# Patient Record
Sex: Female | Born: 1990 | Race: Black or African American | Hispanic: No | Marital: Single | Smoking: Never smoker
Health system: Southern US, Community
[De-identification: ages and names within clinical notes are randomized; demographics above are authoritative.]

## PROBLEM LIST (undated history)

## (undated) ENCOUNTER — Observation Stay: Admission: RE | Payer: Medicaid HMO | Source: Ambulatory Visit | Admitting: Obstetrics & Gynecology

## (undated) ENCOUNTER — Emergency Department
Admission: EM | Disposition: A | Discharge status: Other Acute Inpatient Hospital | Payer: Medicaid HMO | Attending: Emergency Medicine | Admitting: Emergency Medicine

## (undated) DIAGNOSIS — F32A Depression, unspecified: Secondary | ICD-10-CM

## (undated) DIAGNOSIS — K219 Gastro-esophageal reflux disease without esophagitis: Secondary | ICD-10-CM

## (undated) DIAGNOSIS — Z6841 Body Mass Index (BMI) 40.0 and over, adult: Secondary | ICD-10-CM

## (undated) DIAGNOSIS — R7303 Prediabetes: Secondary | ICD-10-CM

## (undated) DIAGNOSIS — Z973 Presence of spectacles and contact lenses: Secondary | ICD-10-CM

## (undated) DIAGNOSIS — F329 Major depressive disorder, single episode, unspecified: Secondary | ICD-10-CM

## (undated) DIAGNOSIS — D649 Anemia, unspecified: Secondary | ICD-10-CM

## (undated) DIAGNOSIS — F419 Anxiety disorder, unspecified: Secondary | ICD-10-CM

## (undated) DIAGNOSIS — J189 Pneumonia, unspecified organism: Secondary | ICD-10-CM

## (undated) DIAGNOSIS — S83282A Other tear of lateral meniscus, current injury, left knee, initial encounter: Secondary | ICD-10-CM

## (undated) HISTORY — PX: OTHER SURGICAL HISTORY: SHX169

## (undated) HISTORY — PX: TONSILLECTOMY: SUR1361

## (undated) HISTORY — PX: WISDOM TOOTH EXTRACTION: SHX21

## (undated) HISTORY — PX: KNEE SURGERY: SHX244

---

## 2009-03-30 ENCOUNTER — Emergency Department: Admit: 2009-03-30 | Payer: Self-pay | Source: Emergency Department | Admitting: Emergency Medicine

## 2009-06-20 ENCOUNTER — Emergency Department: Admit: 2009-06-20 | Payer: Self-pay | Source: Emergency Department | Admitting: Emergency Medicine

## 2009-06-20 LAB — BASIC METABOLIC PANEL
BUN: 16 mg/dL (ref 7.0–19.0)
CO2: 20 mEq/L — ABNORMAL LOW (ref 22–29)
Calcium: 8.8 mg/dL (ref 8.5–10.5)
Chloride: 107 mEq/L (ref 98–107)
Creatinine: 0.7 mg/dL (ref 0.6–1.0)
Glucose: 99 mg/dL (ref 70–100)
Potassium: 4.1 mEq/L (ref 3.5–5.1)
Sodium: 139 mEq/L (ref 136–145)

## 2009-06-20 LAB — CBC AND DIFFERENTIAL
Basophils Absolute: 0 10*3/uL (ref 0.00–0.20)
Basophils: 0 % (ref 0–2)
Eosinophils Absolute: 0.1 10*3/uL (ref 0.00–0.70)
Eosinophils: 2 % (ref 0–5)
Granulocytes Absolute: 3.3 10*3/uL (ref 1.80–8.10)
Hematocrit: 31 % — ABNORMAL LOW (ref 37.0–47.0)
Hgb: 10.3 g/dL — ABNORMAL LOW (ref 12.0–16.0)
Immature Granulocytes Absolute: 0.03 10*3/uL
Immature Granulocytes: 1 % (ref 0–1)
Lymphocytes Absolute: 2.9 10*3/uL (ref 0.50–4.40)
Lymphocytes: 43 % — ABNORMAL HIGH (ref 15–41)
MCH: 24.8 pg — ABNORMAL LOW (ref 28.0–32.0)
MCHC: 33.2 g/dL (ref 32.0–36.0)
MCV: 74.7 fL — ABNORMAL LOW (ref 80.0–100.0)
MPV: 10.5 fL (ref 9.4–12.3)
Monocytes Absolute: 0.3 10*3/uL (ref 0.00–1.20)
Monocytes: 5 % (ref 0–11)
Neutrophils %: 49 % — ABNORMAL LOW (ref 52–75)
Platelets: 285 10*3/uL (ref 140–400)
RBC: 4.15 10*6/uL — ABNORMAL LOW (ref 4.20–5.40)
RDW: 14 % (ref 12–15)
WBC: 6.64 10*3/uL (ref 3.50–10.80)

## 2009-06-20 LAB — D-DIMER - SOFT: D-Dimer: <100 ng/mL (ref 0–400)

## 2009-06-20 LAB — HEMOLYSIS INDEX: Hemolysis Index: 3

## 2011-02-05 LAB — ECG 12-LEAD
Atrial Rate: 64 {beats}/min
P Axis: 18 degrees
P-R Interval: 170 ms
Q-T Interval: 416 ms
QRS Duration: 84 ms
QTC Calculation (Bezet): 429 ms
R Axis: 50 degrees
T Axis: 38 degrees
Ventricular Rate: 64 {beats}/min

## 2011-06-24 ENCOUNTER — Emergency Department
Admit: 2011-06-24 | Discharge: 2011-06-24 | Disposition: A | Discharge status: Home / Self Care | Payer: Self-pay | Source: Emergency Department | Admitting: Emergency Medicine

## 2011-06-24 LAB — CBC AND DIFFERENTIAL
Basophils Absolute Automated: 0.04 10*3/uL (ref 0.00–0.20)
Basophils Automated: 1 % (ref 0–2)
Eosinophils Absolute Automated: 0.24 10*3/uL (ref 0.00–0.70)
Eosinophils Automated: 3 % (ref 0–5)
Hematocrit: 35.3 % — ABNORMAL LOW (ref 37.0–47.0)
Hgb: 11.6 g/dL — ABNORMAL LOW (ref 12.0–16.0)
Lymphocytes Absolute Automated: 2.27 10*3/uL (ref 0.50–4.40)
Lymphocytes Automated: 31 % (ref 15–41)
MCH: 24.8 pg — ABNORMAL LOW (ref 28.0–32.0)
MCHC: 32.9 g/dL (ref 32.0–36.0)
MCV: 75.6 fL — ABNORMAL LOW (ref 80.0–100.0)
MPV: 9.7 fL (ref 9.4–12.3)
Monocytes Absolute Automated: 0.49 10*3/uL (ref 0.00–1.20)
Monocytes: 7 % (ref 0–11)
Neutrophils Absolute: 4.18 10*3/uL (ref 1.80–8.10)
Neutrophils: 58 % (ref 52–75)
Platelets: 366 10*3/uL (ref 140–400)
RBC: 4.67 10*6/uL (ref 4.20–5.40)
RDW: 14 % (ref 12–15)
WBC: 7.22 10*3/uL (ref 3.50–10.80)

## 2011-06-24 LAB — BASIC METABOLIC PANEL
BUN: 12 mg/dL (ref 7–21)
CO2: 27 mEq/L (ref 22–31)
Calcium: 9.2 mg/dL (ref 8.6–10.2)
Chloride: 104 mEq/L (ref 98–107)
Creatinine: 0.7 mg/dL (ref 0.5–1.4)
Glucose: 80 mg/dL (ref 70–100)
Potassium: 4.2 mEq/L (ref 3.6–5.0)
Sodium: 143 mEq/L (ref 136–143)

## 2011-06-24 LAB — URINALYSIS, REFLEX TO MICROSCOPIC EXAM IF INDICATED
Bilirubin, UA: NEGATIVE
Blood, UA: NEGATIVE
Glucose, UA: NEGATIVE
Ketones UA: NEGATIVE
Leukocyte Esterase, UA: NEGATIVE
Nitrite, UA: NEGATIVE
Protein, UR: NEGATIVE
Specific Gravity UA POCT: 1.025 (ref 1.001–1.035)
Urine pH: 6.5 (ref 5.0–8.0)
Urobilinogen, UA: 0.2 mg/dL

## 2011-06-24 LAB — HEPATIC FUNCTION PANEL
ALT: 28 U/L (ref 4–36)
AST (SGOT): 17 U/L (ref 10–41)
Albumin/Globulin Ratio: 1.1 (ref 1.1–1.8)
Albumin: 3.7 g/dL (ref 3.4–4.9)
Alkaline Phosphatase: 81 U/L (ref 43–112)
Bilirubin Direct: 0 mg/dL (ref 0.0–0.3)
Bilirubin Indirect: 0.1 mg/dL (ref 0.0–1.1)
Bilirubin, Total: 0.3 mg/dL (ref 0.2–1.0)
Globulin: 3.3 g/dL (ref 2.0–3.7)
Protein, Total: 7 g/dL (ref 6.0–8.0)

## 2011-06-24 LAB — GFR: EGFR: >60

## 2011-06-24 LAB — SEDIMENTATION RATE: Sed Rate: 26 mm/Hr (ref 0–26)

## 2011-06-24 LAB — CK: Creatine Kinase (CK): 114 U/L (ref 0–140)

## 2011-06-24 LAB — LIPASE: Lipase: 83 U/L (ref 23–300)

## 2011-06-24 LAB — IHS D-DIMER: D-Dimer: 0.35 ug/ml FEU (ref 0.00–0.51)

## 2011-06-24 LAB — TROPONIN I: Troponin I: <0.01 ng/mL (ref 0.00–0.03)

## 2011-06-24 LAB — URINE HCG QUALITATIVE: Urine HCG Qualitative: NEGATIVE

## 2011-06-25 LAB — ECG 12-LEAD
Atrial Rate: 64 {beats}/min
P Axis: 49 degrees
P-R Interval: 166 ms
Q-T Interval: 398 ms
QRS Duration: 82 ms
QTC Calculation (Bezet): 410 ms
R Axis: 60 degrees
T Axis: 22 degrees
Ventricular Rate: 64 {beats}/min

## 2011-09-01 ENCOUNTER — Emergency Department
Admit: 2011-09-01 | Discharge: 2011-09-01 | Disposition: A | Discharge status: Home / Self Care | Payer: Self-pay | Source: Emergency Department | Admitting: Emergency Medicine

## 2011-09-02 LAB — ECG 12-LEAD
Atrial Rate: 64 {beats}/min
P Axis: 22 degrees
P-R Interval: 166 ms
Q-T Interval: 390 ms
QRS Duration: 86 ms
QTC Calculation (Bezet): 402 ms
R Axis: 50 degrees
T Axis: 19 degrees
Ventricular Rate: 64 {beats}/min

## 2013-11-08 ENCOUNTER — Emergency Department (HOSPITAL_BASED_OUTPATIENT_CLINIC_OR_DEPARTMENT_OTHER)
Admission: EM | Admit: 2013-11-08 | Discharge: 2013-11-08 | Disposition: A | Discharge status: Home / Self Care | Payer: Medicaid - Out of State | Attending: Emergency Medicine | Admitting: Emergency Medicine

## 2013-11-08 ENCOUNTER — Inpatient Hospital Stay (HOSPITAL_COMMUNITY)
Admission: AD | Admit: 2013-11-08 | Discharge: 2013-11-08 | Discharge status: Against Medical Advice | Payer: Medicaid - Out of State | Source: Ambulatory Visit | Attending: Obstetrics & Gynecology | Admitting: Obstetrics & Gynecology

## 2013-11-08 ENCOUNTER — Encounter (HOSPITAL_BASED_OUTPATIENT_CLINIC_OR_DEPARTMENT_OTHER): Payer: Self-pay | Admitting: Emergency Medicine

## 2013-11-08 DIAGNOSIS — X58XXXA Exposure to other specified factors, initial encounter: Secondary | ICD-10-CM | POA: Insufficient documentation

## 2013-11-08 DIAGNOSIS — Y9289 Other specified places as the place of occurrence of the external cause: Secondary | ICD-10-CM | POA: Insufficient documentation

## 2013-11-08 DIAGNOSIS — S40021A Contusion of right upper arm, initial encounter: Secondary | ICD-10-CM

## 2013-11-08 DIAGNOSIS — S40029A Contusion of unspecified upper arm, initial encounter: Secondary | ICD-10-CM | POA: Insufficient documentation

## 2013-11-08 DIAGNOSIS — Y9389 Activity, other specified: Secondary | ICD-10-CM | POA: Insufficient documentation

## 2013-11-08 MED ORDER — NAPROXEN 500 MG PO TABS: 500.0000 mg | ORAL_TABLET | Freq: Two times a day (BID) | ORAL | Status: DC

## 2013-11-08 NOTE — Discharge Instructions (Signed)
Apply ice to your hematoma. Take Naprosyn as needed for pain. Refer to attached documents for more information.

## 2013-11-08 NOTE — MAU Note (Signed)
[facility] registration [facility]

## 2013-11-08 NOTE — ED Provider Notes (Signed)
Medical screening examination/treatment/procedure(s) were performed by non-physician practitioner and as supervising physician I was immediately available for consultation/collaboration.   EKG Interpretation None       Doug SouSam Jacubowitz, MD 11/08/13 2356

## 2013-11-08 NOTE — ED Notes (Signed)
Pt. Reports  She had blood taken / plasma taken on last Thursday.  Pt. Reports pain starting in the R arm on Monday with bruising.  Pt. Reports pain as sharp shooting in the arm and down to the wrist on the R.

## 2013-11-08 NOTE — ED Provider Notes (Signed)
CSN: [id number]     Arrival date & time 11/08/13  1909 History   First MD Initiated Contact with Patient 11/08/13 1937     Chief Complaint  Patient presents with  . Extremity Pain     (Consider location/radiation/quality/duration/timing/severity/associated sxs/prior Treatment) Patient is a 23 y.o. female presenting with extremity pain. The history is provided by the patient. No language interpreter was used.  Extremity Pain This is a new problem. The current episode started in the past 7 days. The problem occurs constantly. The problem has been unchanged. Associated symptoms include arthralgias. Pertinent negatives include no abdominal pain, chest pain, chills, fatigue, fever, nausea, neck pain, vomiting or weakness. Exacerbated by: palpation of the affected area. She has tried ice and acetaminophen for the symptoms. The treatment provided no relief.    History reviewed. No pertinent past medical history. History reviewed. No pertinent past surgical history. No family history on file. History  Substance Use Topics  . Smoking status: Never Smoker   . Smokeless tobacco: Not on file  . Alcohol Use: No   OB History   Grav Para Term Preterm Abortions TAB SAB Ect Mult Living                 Review of Systems  Constitutional: Negative for fever, chills and fatigue.  HENT: Negative for trouble swallowing.   Eyes: Negative for visual disturbance.  Respiratory: Negative for shortness of breath.   Cardiovascular: Negative for chest pain and palpitations.  Gastrointestinal: Negative for nausea, vomiting, abdominal pain and diarrhea.  Genitourinary: Negative for dysuria and difficulty urinating.  Musculoskeletal: Positive for arthralgias. Negative for neck pain.  Skin: Negative for color change.  Neurological: Negative for dizziness and weakness.  Psychiatric/Behavioral: Negative for dysphoric mood.      Allergies  Review of patient's allergies indicates no known allergies.  Home  Medications   Prior to Admission medications   Not on File   BP 135/55  Pulse 75  Temp(Src) 99 F (37.2 C) (Oral)  Resp 18  Ht 5\' 8"  (1.727 m)  Wt 340 lb (154.223 kg)  BMI 51.71 kg/m2  SpO2 100%  LMP 10/20/2013 Physical Exam  Nursing note and vitals reviewed. Constitutional: She is oriented to person, place, and time. She appears well-developed and well-nourished. No distress.  HENT:  Head: Normocephalic and atraumatic.  Eyes: Conjunctivae and EOM are normal.  Neck: Normal range of motion.  Cardiovascular: Normal rate, regular rhythm and intact distal pulses.  Exam reveals no gallop and no friction rub.   No murmur heard. Bilateral radial pulses intact and equal.   Pulmonary/Chest: Effort normal and breath sounds normal. She has no wheezes. She has no rales. She exhibits no tenderness.  Musculoskeletal: Normal range of motion.  Neurological: She is alert and oriented to person, place, and time. Coordination normal.  Speech is goal-oriented. Moves limbs without ataxia.   Skin: Skin is warm and dry.  Hematoma distal of right AC with purple discoloration of the skin and tenderness to palpation over the right AC.   Psychiatric: She has a normal mood and affect. Her behavior is normal.    ED Course  Procedures (including critical care time) Labs Review Labs Reviewed - No data to display  Imaging Review No results found.   EKG Interpretation None      MDM   Final diagnoses:  Traumatic hematoma of right upper arm    7:45 PM Patient has a hematoma of right AC from donating blood. No signs of  infection. Patient will be discharged with naprosyn. Vitals stable and patient afebrile. No neurovascular compromise.     Emilia BeckKaitlyn Szekalski, PA-C 11/08/13 1954

## 2013-11-24 ENCOUNTER — Encounter (HOSPITAL_BASED_OUTPATIENT_CLINIC_OR_DEPARTMENT_OTHER): Payer: Self-pay | Admitting: Emergency Medicine

## 2013-11-24 ENCOUNTER — Emergency Department (HOSPITAL_BASED_OUTPATIENT_CLINIC_OR_DEPARTMENT_OTHER)
Admission: EM | Admit: 2013-11-24 | Discharge: 2013-11-24 | Disposition: A | Discharge status: Home / Self Care | Payer: Medicaid - Out of State | Attending: Emergency Medicine | Admitting: Emergency Medicine

## 2013-11-24 DIAGNOSIS — Z791 Long term (current) use of non-steroidal anti-inflammatories (NSAID): Secondary | ICD-10-CM | POA: Insufficient documentation

## 2013-11-24 DIAGNOSIS — R112 Nausea with vomiting, unspecified: Secondary | ICD-10-CM | POA: Insufficient documentation

## 2013-11-24 DIAGNOSIS — H579 Unspecified disorder of eye and adnexa: Secondary | ICD-10-CM | POA: Diagnosis present

## 2013-11-24 DIAGNOSIS — H1130 Conjunctival hemorrhage, unspecified eye: Secondary | ICD-10-CM | POA: Diagnosis not present

## 2013-11-24 DIAGNOSIS — H1133 Conjunctival hemorrhage, bilateral: Secondary | ICD-10-CM

## 2013-11-24 MED ORDER — FLUORESCEIN SODIUM 1 MG OP STRP
ORAL_STRIP | OPHTHALMIC | Status: AC
  Administered 2013-11-24: 23:00:00
  Filled 2013-11-24: qty 1

## 2013-11-24 MED ORDER — TETRACAINE HCL 0.5 % OP SOLN
OPHTHALMIC | Status: AC
  Administered 2013-11-24: 23:00:00
  Filled 2013-11-24: qty 2

## 2013-11-24 MED ORDER — ONDANSETRON 4 MG PO TBDP
4.0000 mg | ORAL_TABLET | Freq: Once | ORAL | Status: AC
  Administered 2013-11-24: 4 mg via ORAL
  Filled 2013-11-24: qty 1

## 2013-11-24 MED ORDER — HYDROCODONE-ACETAMINOPHEN 5-325 MG PO TABS
2.0000 | ORAL_TABLET | Freq: Once | ORAL | Status: AC
  Administered 2013-11-24: 2 via ORAL
  Filled 2013-11-24: qty 2

## 2013-11-24 NOTE — ED Notes (Signed)
At d/c pt. Still with concern about bilateral eye pain. States she has been drinking fluids and not nauseated at present. I asked Trisha MangleKaren Sophia, PA to speak with pt. Trisha MangleKaren Sophia and Dr. Criss AlvineGoldston, MD at beside to speak with patient.  No change in plan of care and orders received to d/c patient home as written.

## 2013-11-24 NOTE — Discharge Instructions (Signed)
Subconjunctival Hemorrhage A subconjunctival hemorrhage is a bright red patch covering a portion of the white of the eye. The white part of the eye is called the sclera, and it is covered by a thin membrane called the conjunctiva. This membrane is clear, except for tiny blood vessels that you can see with the naked eye. When your eye is irritated or inflamed and becomes red, it is because the vessels in the conjunctiva are swollen. Sometimes, a blood vessel in the conjunctiva can break and bleed. When this occurs, the blood builds up between the conjunctiva and the sclera, and spreads out to create a red area. The red spot may be very small at first. It may then spread to cover a larger part of the surface of the eye, or even all of the visible white part of the eye. In almost all cases, the blood will go away and the eye will become white again. Before completely dissolving, however, the red area may spread. It may also become brownish-yellow in color, before going away. If a lot of blood collects under the conjunctiva, it may look like a bulge on the surface of the eye. This looks scary, but it will also eventually flatten out and go away. Subconjunctival hemorrhages do not cause pain, but if swollen, may cause a feeling of irritation. There is no effect on vision.  CAUSES   The most common cause is mild trauma (rubbing the eye, irritation).  Subconjunctival hemorrhages can happen because of coughing or straining (lifting heavy objects), vomiting, or sneezing.  In some cases, your doctor may want to check your blood pressure. High blood pressure can also cause a sunconjunctival hemorrhage.  Severe trauma or blunt injuries.  Diseases that affect blood clotting (hemophilia, leukemia).  Abnormalities of blood vessels behind the eye (carotid cavernous sinus fistula).  Tumors behind the eye.  Certain drugs (aspirin, coumadin, heparin).  Recent eye surgery. HOME CARE INSTRUCTIONS   Do not worry  about the appearance of your eye. You may continue your usual activities.  Often, follow-up is not necessary. SEEK MEDICAL CARE IF:   Your eye becomes painful.  The bleeding does not disappear within 3 weeks.  Bleeding occurs elsewhere, for example, under the skin, in the mouth, or in the other eye.  You have recurring subconjunctival hemorrhages. SEEK IMMEDIATE MEDICAL CARE IF:   Your vision changes or you have difficulty seeing.  You develop severe headache, persistent vomiting, confusion, or abnormal drowsiness (lethargy).  Your eye seems to bulge or protrude from the eye socket.  You notice the sudden appearance of bruises, or have spontaneous bleeding elsewhere on your body. Document Released: 05/10/2005 Document Revised: 08/02/2011 Document Reviewed: 04/07/2009 [facility] Patient Information 2015 [place], [place]. This information is not intended to replace advice given to you by your health care provider. Make sure you discuss any questions you have with your health care provider. Nausea and Vomiting Nausea is a sick feeling that often comes before throwing up (vomiting). Vomiting is a reflex where stomach contents come out of your mouth. Vomiting can cause severe loss of body fluids (dehydration). Children and elderly adults can become dehydrated quickly, especially if they also have diarrhea. Nausea and vomiting are symptoms of a condition or disease. It is important to find the cause of your symptoms. CAUSES  Direct irritation of the stomach lining. This irritation can result from increased acid production (gastroesophageal reflux disease), infection, food poisoning, taking certain medicines (such as nonsteroidal anti-inflammatory drugs), alcohol use, or tobacco  use. Signals from the brain.These signals could be caused by a headache, heat exposure, an inner ear disturbance, increased pressure in the brain from injury, infection, a tumor, or a concussion, pain, emotional stimulus,  or metabolic problems. An obstruction in the gastrointestinal tract (bowel obstruction). Illnesses such as diabetes, hepatitis, gallbladder problems, appendicitis, kidney problems, cancer, sepsis, atypical symptoms of a heart attack, or eating disorders. Medical treatments such as chemotherapy and radiation. Receiving medicine that makes you sleep (general anesthetic) during surgery. DIAGNOSIS Your caregiver may ask for tests to be done if the problems do not improve after a few days. Tests may also be done if symptoms are severe or if the reason for the nausea and vomiting is not clear. Tests may include: Urine tests. Blood tests. Stool tests. Cultures (to look for evidence of infection). X-rays or other imaging studies. Test results can help your caregiver make decisions about treatment or the need for additional tests. TREATMENT You need to stay well hydrated. Drink frequently but in small amounts.You may wish to drink water, sports drinks, clear broth, or eat frozen ice pops or gelatin dessert to help stay hydrated.When you eat, eating slowly may help prevent nausea.There are also some antinausea medicines that may help prevent nausea. HOME CARE INSTRUCTIONS  Take all medicine as directed by your caregiver. If you do not have an appetite, do not force yourself to eat. However, you must continue to drink fluids. If you have an appetite, eat a normal diet unless your caregiver tells you differently. Eat a variety of complex carbohydrates (rice, wheat, potatoes, bread), lean meats, yogurt, fruits, and vegetables. Avoid high-fat foods because they are more difficult to digest. Drink enough water and fluids to keep your urine clear or pale yellow. If you are dehydrated, ask your caregiver for specific rehydration instructions. Signs of dehydration may include: Severe thirst. Dry lips and mouth. Dizziness. Dark urine. Decreasing urine frequency and amount. Confusion. Rapid breathing or  pulse. SEEK IMMEDIATE MEDICAL CARE IF:  You have blood or brown flecks (like coffee grounds) in your vomit. You have black or bloody stools. You have a severe headache or stiff neck. You are confused. You have severe abdominal pain. You have chest pain or trouble breathing. You do not urinate at least once every 8 hours. You develop cold or clammy skin. You continue to vomit for longer than 24 to 48 hours. You have a fever. MAKE SURE YOU:  Understand these instructions. Will watch your condition. Will get help right away if you are not doing well or get worse. Document Released: 05/10/2005 Document Revised: 08/02/2011 Document Reviewed: 10/07/2010 [facility]ExitCare Patient Information 2015 [place]ExitCare, [place]LL[place]. This information is not intended to replace advice given to you by your health care provider. Make sure you discuss any questions you have with your health care provider.

## 2013-11-24 NOTE — ED Notes (Signed)
Pt states that she vomited this afternoon and developed difficulty seeing, she reports bleeding into her eye,
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
## 2013-11-24 NOTE — ED Provider Notes (Signed)
CSN: [id number]     Arrival date & time 11/24/13  2121 History   First MD Initiated Contact with Patient 11/24/13 2223     Chief Complaint  Patient presents with  . Eye Problem     (Consider location/radiation/quality/duration/timing/severity/associated sxs/prior Treatment) Patient is a 23 y.o. female presenting with eye problem. The history is provided by the patient. No language interpreter was used.  Eye Problem Location:  Both Quality:  Aching Severity:  Moderate Onset quality:  Gradual Timing:  Constant Progression:  Worsening Chronicity:  New Relieved by:  Nothing Worsened by:  Nothing tried Ineffective treatments:  None tried Associated symptoms: redness and vomiting   Risk factors: conjunctival hemorrhage   Pt reports she ate a hamburger that was undercooked.   History reviewed. No pertinent past medical history. History reviewed. No pertinent past surgical history. History reviewed. No pertinent family history. History  Substance Use Topics  . Smoking status: Never Smoker   . Smokeless tobacco: Not on file  . Alcohol Use: No   OB History   Grav Para Term Preterm Abortions TAB SAB Ect Mult Living                 Review of Systems  Eyes: Positive for pain and redness.  Gastrointestinal: Positive for vomiting.  All other systems reviewed and are negative.     Allergies  Review of patient's allergies indicates no known allergies.  Home Medications   Prior to Admission medications   Medication Sig Start Date End Date Taking? Authorizing Provider  naproxen (NAPROSYN) 500 MG tablet Take 1 tablet (500 mg total) by mouth 2 (two) times daily with a meal. 11/08/13  Yes Kaitlyn Szekalski, PA-C   BP 147/77  Pulse 61  Temp(Src) 98.1 F (36.7 C) (Oral)  Resp 20  Ht 5\' 8"  (1.727 m)  Wt 374 lb (169.645 kg)  BMI 56.88 kg/m2  SpO2 100%  LMP 11/24/2013 Physical Exam  Nursing note and vitals reviewed. Constitutional: She is oriented to person, place, and time.  She appears well-developed and well-nourished.  HENT:  Head: Normocephalic and atraumatic.  Eyes: Conjunctivae are normal. Pupils are equal, round, and reactive to light.  bilat conjunctival hemorrhage   Neck: Normal range of motion. Neck supple.  Cardiovascular: Normal rate.   Pulmonary/Chest: Effort normal.  Neurological: She is alert and oriented to person, place, and time. She has normal reflexes.  Skin: Skin is warm.  Psychiatric: She has a normal mood and affect.    ED Course  Procedures (including critical care time) Labs Review Labs Reviewed - No data to display  Imaging Review No results found.   EKG Interpretation None      MDM   Final diagnoses:  Nausea and vomiting, vomiting of unspecified type  Subconjunctival hemorrhage of both eyes    Pt counseled on subconjunctival hemmorhage.  Pt given zofran for nausea.  Pt complains of discomfort.  Pt given hydrocodone for pain    Elson AreasLeslie K Sofia, [place] JerseyPA-C 11/24/13 2321

## 2013-11-26 NOTE — ED Provider Notes (Signed)
Medical screening examination/treatment/procedure(s) were conducted as a shared visit with non-physician practitioner(s) and myself.  I personally evaluated the patient during the encounter.   EKG Interpretation None       Patient with vomitign after eating and then bilateral eye pain and redness c/w subconjunctival hemorrhage. Stable for discharge.  Leslie Reyes T Goodwin Kamphaus, MD 11/26/13 [phone]

## 2013-12-05 ENCOUNTER — Encounter (HOSPITAL_BASED_OUTPATIENT_CLINIC_OR_DEPARTMENT_OTHER): Payer: Self-pay | Admitting: Emergency Medicine

## 2013-12-05 ENCOUNTER — Emergency Department (HOSPITAL_BASED_OUTPATIENT_CLINIC_OR_DEPARTMENT_OTHER)
Admission: EM | Admit: 2013-12-05 | Discharge: 2013-12-05 | Disposition: A | Discharge status: Home / Self Care | Payer: Medicaid - Out of State | Attending: Emergency Medicine | Admitting: Emergency Medicine

## 2013-12-05 DIAGNOSIS — Z791 Long term (current) use of non-steroidal anti-inflammatories (NSAID): Secondary | ICD-10-CM | POA: Insufficient documentation

## 2013-12-05 DIAGNOSIS — Z79899 Other long term (current) drug therapy: Secondary | ICD-10-CM | POA: Insufficient documentation

## 2013-12-05 DIAGNOSIS — IMO0002 Reserved for concepts with insufficient information to code with codable children: Secondary | ICD-10-CM | POA: Insufficient documentation

## 2013-12-05 DIAGNOSIS — S8000XA Contusion of unspecified knee, initial encounter: Secondary | ICD-10-CM | POA: Diagnosis not present

## 2013-12-05 DIAGNOSIS — T07XXXA Unspecified multiple injuries, initial encounter: Secondary | ICD-10-CM

## 2013-12-05 MED ORDER — HYDROCODONE-ACETAMINOPHEN 5-325 MG PO TABS: 2.0000 | ORAL_TABLET | ORAL | Status: DC | PRN

## 2013-12-05 MED ORDER — METHOCARBAMOL 500 MG PO TABS: 500.0000 mg | ORAL_TABLET | Freq: Two times a day (BID) | ORAL | Status: DC

## 2013-12-05 NOTE — Discharge Instructions (Signed)
Contusion °A contusion is a deep bruise. Contusions are the result of an injury that caused bleeding under the skin. The contusion may turn blue, purple, or yellow. Minor injuries will give you a painless contusion, but more severe contusions may stay painful and swollen for a few weeks.  °CAUSES  °A contusion is usually caused by a blow, trauma, or direct force to an area of the body. °SYMPTOMS  °· Swelling and redness of the injured area. °· Bruising of the injured area. °· Tenderness and soreness of the injured area. °· Pain. °DIAGNOSIS  °The diagnosis can be made by taking a history and physical exam. An X-ray, CT scan, or MRI may be needed to determine if there were any associated injuries, such as fractures. °TREATMENT  °Specific treatment will depend on what area of the body was injured. In general, the best treatment for a contusion is resting, icing, elevating, and applying cold compresses to the injured area. Over-the-counter medicines may also be recommended for pain control. Ask your caregiver what the best treatment is for your contusion. °HOME CARE INSTRUCTIONS  °· Put ice on the injured area. °¨ Put ice in a plastic bag. °¨ Place a towel between your skin and the bag. °¨ Leave the ice on for 15-20 minutes, 3-4 times a day, or as directed by your health care provider. °· Only take over-the-counter or prescription medicines for pain, discomfort, or fever as directed by your caregiver. Your caregiver may recommend avoiding anti-inflammatory medicines (aspirin, ibuprofen, and naproxen) for 48 hours because these medicines may increase bruising. °· Rest the injured area. °· If possible, elevate the injured area to reduce swelling. °SEEK IMMEDIATE MEDICAL CARE IF:  °· You have increased bruising or swelling. °· You have pain that is getting worse. °· Your swelling or pain is not relieved with medicines. °MAKE SURE YOU:  °· Understand these instructions. °· Will watch your condition. °· Will get help right  away if you are not doing well or get worse. °Document Released: 02/17/2005 Document Revised: 05/15/2013 Document Reviewed: 03/15/2011 °ExitCare® Patient Information ©2015 [employer], LLC. This information is not intended to replace advice given to you by your health care provider. Make sure you discuss any questions you have with your health care provider. ° °
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
## 2013-12-05 NOTE — ED Notes (Signed)
Pt states she feels like she is wheezing again. Lungs clear in all fields. No resp distress noted. Sats 99%.

## 2013-12-05 NOTE — ED Notes (Signed)
Pt reports she lives here and feels safe in her home,
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
## 2013-12-07 NOTE — ED Provider Notes (Signed)
Medical screening examination/treatment/procedure(s) were performed by non-physician practitioner and as supervising physician I was immediately available for consultation/collaboration.   EKG Interpretation None       Juliet RudeNathan R. Rubin PayorPickering, MD 12/07/13 1048

## 2013-12-19 ENCOUNTER — Emergency Department (HOSPITAL_BASED_OUTPATIENT_CLINIC_OR_DEPARTMENT_OTHER): Payer: Medicaid - Out of State

## 2013-12-19 ENCOUNTER — Emergency Department (HOSPITAL_BASED_OUTPATIENT_CLINIC_OR_DEPARTMENT_OTHER)
Admission: EM | Admit: 2013-12-19 | Discharge: 2013-12-19 | Disposition: A | Discharge status: Home / Self Care | Payer: Medicaid - Out of State | Attending: Emergency Medicine | Admitting: Emergency Medicine

## 2013-12-19 ENCOUNTER — Encounter (HOSPITAL_BASED_OUTPATIENT_CLINIC_OR_DEPARTMENT_OTHER): Payer: Self-pay | Admitting: Emergency Medicine

## 2013-12-19 DIAGNOSIS — G8911 Acute pain due to trauma: Secondary | ICD-10-CM | POA: Insufficient documentation

## 2013-12-19 DIAGNOSIS — R062 Wheezing: Secondary | ICD-10-CM | POA: Insufficient documentation

## 2013-12-19 DIAGNOSIS — R05 Cough: Secondary | ICD-10-CM | POA: Insufficient documentation

## 2013-12-19 DIAGNOSIS — R059 Cough, unspecified: Secondary | ICD-10-CM | POA: Insufficient documentation

## 2013-12-19 DIAGNOSIS — Z791 Long term (current) use of non-steroidal anti-inflammatories (NSAID): Secondary | ICD-10-CM | POA: Insufficient documentation

## 2013-12-19 DIAGNOSIS — M25569 Pain in unspecified knee: Secondary | ICD-10-CM | POA: Insufficient documentation

## 2013-12-19 DIAGNOSIS — Z79899 Other long term (current) drug therapy: Secondary | ICD-10-CM | POA: Insufficient documentation

## 2013-12-19 DIAGNOSIS — M25561 Pain in right knee: Secondary | ICD-10-CM

## 2013-12-19 MED ORDER — IBUPROFEN 800 MG PO TABS: 800.0000 mg | ORAL_TABLET | Freq: Three times a day (TID) | ORAL | Status: DC

## 2013-12-19 MED ORDER — ALBUTEROL SULFATE HFA 108 (90 BASE) MCG/ACT IN AERS
6.0000 | INHALATION_SPRAY | Freq: Once | RESPIRATORY_TRACT | Status: AC
  Administered 2013-12-19: 6 via RESPIRATORY_TRACT
  Filled 2013-12-19: qty 6.7
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
## 2013-12-19 NOTE — ED Notes (Signed)
Patient transported to X-ray 
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
## 2013-12-19 NOTE — ED Notes (Signed)
PA at bedside.

## 2013-12-19 NOTE — Discharge Instructions (Signed)
Knee Pain Knee pain can be a result of an injury or other medical conditions. Treatment will depend on the cause of your pain. HOME CARE  Only take medicine as told by your doctor.  Keep a healthy weight. Being overweight can make the knee hurt more.  Stretch before exercising or playing sports.  If there is constant knee pain, change the way you exercise. Ask your doctor for advice.  Make sure shoes fit well. Choose the right shoe for the sport or activity.  Protect your knees. Wear kneepads if needed.  Rest when you are tired. GET HELP RIGHT AWAY IF:   Your knee pain does not stop.  Your knee pain does not get better.  Your knee joint feels hot to the touch.  You have a fever. MAKE SURE YOU:   Understand these instructions.  Will watch this condition.  Will get help right away if you are not doing well or get worse. Document Released: 08/06/2008 Document Revised: 08/02/2011 Document Reviewed: 08/06/2008 [facility] Patient Information 2015 [employer], [place]. This information is not intended to replace advice given to you by your health care provider. Make sure you discuss any questions you have with your health care provider.   Metered Dose Inhaler with Spacer Inhaled medicines are the basis of treatment of asthma and other breathing problems. Inhaled medicine can only be effective if used properly. Good technique assures that the medicine reaches the lungs. Your health care provider has asked you to use a spacer with your inhaler to help you take the medicine more effectively. A spacer is a plastic tube with a mouthpiece on one end and an opening that connects to the inhaler on the other end. Metered dose inhalers (MDIs) are used to deliver a variety of inhaled medicines. These include quick relief or rescue medicines (such as bronchodilators) and controller medicines (such as corticosteroids). The medicine is delivered by pushing down on a metal canister to release a set amount  of spray. If you are using different kinds of inhalers, use your quick relief medicine to open the airways 10-15 minutes before using a steroid if instructed to do so by your health care provider. If you are unsure which inhalers to use and the order of using them, ask your health care provider, nurse, or respiratory therapist. HOW TO USE THE INHALER WITH A SPACER 1. Remove cap from inhaler. 2. If you are using the inhaler for the first time, you will need to prime it. Shake the inhaler for 5 seconds and release four puffs into the air, away from your face. Ask your health care provider or pharmacist if you have questions about priming your inhaler. 3. Shake inhaler for 5 seconds before each breath in (inhalation). 4. Place the open end of the spacer onto the mouthpiece of the inhaler. 5. Position the inhaler so that the top of the canister faces up and the spacer mouthpiece faces you. 6. Put your index finger on the top of the medicine canister. Your thumb supports the bottom of the inhaler and the spacer. 7. Breathe out (exhale) normally and as completely as possible. 8. Immediately after exhaling, place the spacer between your teeth and into your mouth. Close your mouth tightly around the spacer. 9. Press the canister down with the index finger to release the medicine. 10. At the same time as the canister is pressed, inhale deeply and slowly until the lungs are completely filled. This should take 4-6 seconds. Keep your tongue down and out  of the way. 11. Hold the medicine in your lungs for 5-10 seconds (10 seconds is best). This helps the medicine get into the small airways of your lungs. Exhale. 12. Repeat inhaling deeply through the spacer mouthpiece. Again hold that breath for up to 10 seconds (10 seconds is best). Exhale slowly. If it is difficult to take this second deep breath through the spacer, breathe normally several times through the spacer. Remove the spacer from your mouth. 13. Wait at  least 15-30 seconds between puffs. Continue with the above steps until you have taken the number of puffs your health care provider has ordered. Do not use the inhaler more than your health care provider directs you to. 14. Remove spacer from the inhaler and place cap on inhaler. 15. Follow the directions from your health care provider or the inhaler insert for cleaning the inhaler and spacer. If you are using a steroid inhaler, rinse your mouth with water after your last puff, gargle, and spit out the water. Do not swallow the water. AVOID:  Inhaling before or after starting the spray of medicine. It takes practice to coordinate your breathing with triggering the spray.  Inhaling through the nose (rather than the mouth) when triggering the spray. HOW TO DETERMINE IF YOUR INHALER IS FULL OR NEARLY EMPTY You cannot know when an inhaler is empty by shaking it. A few inhalers are now being made with dose counters. Ask your health care provider for a prescription that has a dose counter if you feel you need that extra help. If your inhaler does not have a counter, ask your health care provider to help you determine the date you need to refill your inhaler. Write the refill date on a calendar or your inhaler canister. Refill your inhaler 7-10 days before it runs out. Be sure to keep an adequate supply of medicine. This includes making sure it is not expired, and you have a spare inhaler.  SEEK MEDICAL CARE IF:   Symptoms are only partially relieved with your inhaler.  You are having trouble using your inhaler.  You experience some increase in phlegm. SEEK IMMEDIATE MEDICAL CARE IF:   You feel little or no relief with your inhalers. You are still wheezing and are feeling shortness of breath or tightness in your chest or both.  You have dizziness, headaches, or fast heart rate.  You have chills, fever, or night sweats.  There is a noticeable increase in phlegm production, or there is blood in the  phlegm. Document Released: 05/10/2005 Document Revised: 09/24/2013 Document Reviewed: 10/26/2012 [facility] Patient Information 2015 [place], [place]. This information is not intended to replace advice given to you by your health care provider. Make sure you discuss any questions you have with your health care provider.    Emergency Department Resource Guide 1) Find a Doctor and Pay Out of Pocket Although you won't have to find out who is covered by your insurance plan, it is a good idea to ask around and get recommendations. You will then need to call the office and see if the doctor you have chosen will accept you as a new patient and what types of options they offer for patients who are self-pay. Some doctors offer discounts or will set up payment plans for their patients who do not have insurance, but you will need to ask so you aren't surprised when you get to your appointment.  2) Contact Your Local Health Department Not all health departments have doctors that can  see patients for sick visits, but many do, so it is worth a call to see if yours does. If you don't know where your local health department is, you can check in your phone book. The CDC also has a tool to help you locate your state's health department, and many state websites also have listings of all of their local health departments.  3) Find a Walk-in Clinic If your illness is not likely to be very severe or complicated, you may want to try a walk in clinic. These are popping up all over the country in pharmacies, drugstores, and shopping centers. They're usually staffed by nurse practitioners or physician assistants that have been trained to treat common illnesses and complaints. They're usually fairly quick and inexpensive. However, if you have serious medical issues or chronic medical problems, these are probably not your best option.  No Primary Care Doctor: - Call Health Connect at  [phone] - they can help you locate a primary  care doctor that  accepts your insurance, provides certain services, etc. - Physician Referral Service- [phone]  Chronic Pain Problems: Organization         Address  Phone   Notes  [facility]  [phone] Patients need to be referred by their primary care doctor.   Medication Assistance: Organization         Address  Phone   Notes  [facility] Medication [facility] [address] [place] [place], [place] [address] [phone] --Must be a resident of [facility] -- Must have NO insurance coverage whatsoever (no Medicaid/ Medicare, etc.) -- The pt. MUST have a primary care doctor that directs their care regularly and follows them in the community   [facility]  [phone]   [employer]  [phone]    Agencies that provide inexpensive medical care: Organization         Address  Phone   Notes  [facility]  [phone]   [facility]    [phone]   [facility] [address] [place], [place] [address] [phone]   [facility] [address], [place] [phone]   [facility]anned Parenthood    [phone]   [facility]    [phone]   [facility]  [address], [place] Phone:  [phone], Fax:  [phone] Hours of Operation:  9 am - 6 pm, M-F.  Also accepts Medicaid/Medicare and self-pay.  [facility]  [address], [place] Phone: [phone], Fax: [phone]. Hours of Operation:  8:30 am - 5:30 pm, M-F.  Also accepts Medicaid and self-pay.  [facility] [place] [address], [place] Phone: [phone]   [facility] [address], [place] [phone], Ext. [phone] Mondays & Thursdays: 7-9 AM.  First 15 patients are seen on a first come, first serve basis.    Medicaid-accepting [facility]  Providers:  Organization         Address  Phone   Notes  [facility] [address], [place] [phone] Also accepts self-pay patients.  [facility] [address] Laurell Josephs [place], [place]  [phone]   [facility] [address], [place] [phone]   [facility] [address] [place] [address],  [place] [phone]   Renaye Rakers [address], [place]   [phone] Only accepts [place] Access [place] patients after they have their name applied to their card.   Self-Pay (no insurance) in [facility]:  Organization         Address  Phone   Notes  Sickle Cell Patients, [facility] [address] [place], [place] [phone]   [facility] [address] [place], [place] [phone]   [facility] [place]  [address], [place] [phone]   [facility]/Dr. Osei-Bonsu  [address] [place] or [address], [place] [phone] Phone number for both [place] and [place] locations is the same.  [facility] [address], [place] [phone]   [facility] [address], [place] or [address] [phone]   [facility] [address] [place] [phone], phone; [phone], fax Sees patients 1st and 3rd Saturday of every month.  Must not qualify for public or private insurance (i.e. Medicaid, Medicare, [place] Health Choice, Veterans' Benefits)  Household income should be no more than 200% of the poverty level The clinic cannot treat you if you are pregnant or think you are pregnant  Sexually transmitted diseases are not treated at the clinic.    Dental Care: Organization         Address  Phone  Notes  [facility] [address] [place], [place] [phone] Accepts children up to age 58 who are enrolled in [place] or [place] Health Choice; pregnant women with a Medicaid card; and children who have applied for Medicaid or [place] Health Choice, but were declined, whose parents can pay a reduced fee at time of service.  [facility]  [address], [place] [phone] Accepts children up to age 49 who are enrolled in [place] or [place] Health Choice; pregnant women with a Medicaid card; and children who have applied for Medicaid or [place] Health Choice, but were declined, whose parents can pay a reduced fee at time of service.  [facility]  [address] [place], [place] [phone] Patients are seen by appointment only. Walk-ins are not accepted. [facility] will see patients 85 years of age and older. Monday - Tuesday (8am-5pm) Most Wednesdays (8:30-5pm) $30 per visit, cash only  [facility]  [address], [facility] [phone] Patients are seen by appointment only. Walk-ins are not accepted. [facility] will see patients 31 years of age and older. One Wednesday Evening (Monthly: Volunteer Based).  $30 per visit, cash only  [employer]  [phone] for adults; Children under age 39, call Graduate Pediatric Dentistry at [phone]. Children aged 15-14, please call [phone] to request a pediatric application.  Dental services are provided in all areas of dental care including fillings, crowns and bridges, complete and partial dentures, implants, gum treatment, root canals, and extractions. Preventive care is also provided. Treatment is provided to both adults and children. Patients are selected via a lottery and there is often a waiting list.   [facility] [address] [place]  [phone] [email or web address]   [facility]  [address],  [place], [place] [phone], Ext. [phone] Second and Fourth Thursday of each month, opens at 6:30 AM; Clinic ends at 9 AM.  Patients are seen on a first-come first-served basis, and a limited number are seen during each clinic.   [facility]  [address] Ether Griffins [place], [place] [phone]   Eligibility Requirements You must have lived in [place], [place], or [place] counties for at least the last three months.   You cannot be eligible for state or federal sponsored [place], including [employer], [place], or [employer].   You generally cannot be eligible for healthcare insurance through your employer.    How to apply: Eligibility screenings are held every Tuesday and Wednesday afternoon from 1:00 pm until 4:00 pm. You do not need an appointment for the interview!  [facility] [address] [place], [place] [phone]   [facility] Health Department  [phone]   [facility] Health Department  [phone]   [facility] Health Department  [phone]    Behavioral Health Resources in the Community: Intensive Outpatient Programs Organization         Address  Phone  Notes  [facility] [address], [place], [place] [phone]   [facility] [address] [place], [place] [phone]   [facility] [address], [place], [place]  [phone]   [facility] Mental Health [address]  [place], [place] [phone] or [phone]   Substance Abuse Resources Organization         Address  Phone  Notes  [facility]  [phone]   [facility]  [phone]   [facility] [place]  [phone]   Floydene Flock  [phone]   Residential & Outpatient Substance Abuse Program  [phone]   Psychological Services Organization         Address  Phone  Notes  [facility] Behavioral Health  [phone]    [facility] Services  [phone]   [facility] Mental Health [address], [place] [phone] or [phone]    Mobile Crisis Teams Organization         Address  Phone  Notes  [facility], Mobile Crisis Care Unit  [phone]   [facility]  [address] [place], [place] [phone]   Doristine Locks [address] [place] [phone]    Self-Help/Support Groups Organization         Address  Phone             Notes  [facility] [place] - variety of support groups  336- I7437963 Call for more information  Narcotics Anonymous (NA), [facility] [address], [employer] [place]  2 meetings at this location   Statistician         Address  Phone  Notes  [facility] [address],    [place]  [phone]   [employer]  [address], [place] [address], [place], [place] [phone]   [facility] [address] [place], [place] [phone] Admissions: 8am-3pm M-F  [facility] [address],    [place], [place] [phone]   [facility] [address] Starling Manns [place], [place] [phone]   [facility] [address]  [place], [place] [phone]   [facility] - Intensive Outpatient [address] Laurell Joseph[address], [place], [place] [phone]  [facility] ([facility] [address] [place]  [place], [place] [phone] or [phone]   [facility] ([facility]) [address] [place], [place] [phone] Accepts Medicaid  [facility] [address]  [place] [phone] Substance Abuse/Addiction Treatment   [facility] Organization         Address  Phone  Notes  [facility]  [phone]   Angie Fava, PhD [address] Ervin Knack [place], [place]   [phone] or [phone]    [facility]al   [address] [place], [place] [phone]   [facility] [address] [place], [place] [phone] Insurance/Medicaid/sponsorship through [facility] and Families [address]                                    [place], [place] [phone] Therapy/tele-psych/case  [facility] [address] [place], [place] [phone]    Dr. Lolly Mustache  [phone]   [facility]  [employer] [facility] Dept. 1) [address], [place] 2) [address] [place] 3)  [address], [place] [phone]  [phone]   [facility] Child Abuse Hotline (509)336-6510 or [phone] (After Hours)

## 2013-12-20 NOTE — ED Provider Notes (Signed)
Medical screening examination/treatment/procedure(s) were conducted as a shared visit with non-physician practitioner(s) and myself.  I personally evaluated the patient during the encounter.   EKG Interpretation None      I interviewed and examined the patient. Lungs w/ mild exp wheeze bilaterally. Cardiac exam wnl. Abdomen soft.  Mild ttp of proximal fibula of RLE. Plain films non-contrib. Pt denies sob. Will provide inhaler for home.   Junius ArgyleForrest S Cybill Uriegas, MD 12/20/13 [phone]

## 2014-01-19 ENCOUNTER — Emergency Department (HOSPITAL_BASED_OUTPATIENT_CLINIC_OR_DEPARTMENT_OTHER): Payer: Medicaid - Out of State

## 2014-01-19 ENCOUNTER — Encounter (HOSPITAL_BASED_OUTPATIENT_CLINIC_OR_DEPARTMENT_OTHER): Payer: Self-pay | Admitting: Emergency Medicine

## 2014-01-19 ENCOUNTER — Emergency Department (HOSPITAL_BASED_OUTPATIENT_CLINIC_OR_DEPARTMENT_OTHER)
Admission: EM | Admit: 2014-01-19 | Discharge: 2014-01-19 | Disposition: A | Discharge status: Home / Self Care | Payer: Medicaid - Out of State | Attending: Emergency Medicine | Admitting: Emergency Medicine

## 2014-01-19 DIAGNOSIS — M7981 Nontraumatic hematoma of soft tissue: Secondary | ICD-10-CM | POA: Insufficient documentation

## 2014-01-19 DIAGNOSIS — J4521 Mild intermittent asthma with (acute) exacerbation: Secondary | ICD-10-CM

## 2014-01-19 DIAGNOSIS — S40021A Contusion of right upper arm, initial encounter: Secondary | ICD-10-CM

## 2014-01-19 DIAGNOSIS — M6281 Muscle weakness (generalized): Secondary | ICD-10-CM | POA: Diagnosis present

## 2014-01-19 DIAGNOSIS — J45901 Unspecified asthma with (acute) exacerbation: Secondary | ICD-10-CM | POA: Diagnosis not present

## 2014-01-19 MED ORDER — PREDNISONE 10 MG PO TABS: ORAL_TABLET | ORAL | Status: DC

## 2014-01-19 MED ORDER — IBUPROFEN 800 MG PO TABS: 800.0000 mg | ORAL_TABLET | Freq: Three times a day (TID) | ORAL | Status: DC

## 2014-01-19 NOTE — Discharge Instructions (Signed)
Contusion °A contusion is a deep bruise. Contusions are the result of an injury that caused bleeding under the skin. The contusion may turn blue, purple, or yellow. Minor injuries will give you a painless contusion, but more severe contusions may stay painful and swollen for a few weeks.  °CAUSES  °A contusion is usually caused by a blow, trauma, or direct force to an area of the body. °SYMPTOMS  °· Swelling and redness of the injured area. °· Bruising of the injured area. °· Tenderness and soreness of the injured area. °· Pain. °DIAGNOSIS  °The diagnosis can be made by taking a history and physical exam. An X-ray, CT scan, or MRI may be needed to determine if there were any associated injuries, such as fractures. °TREATMENT  °Specific treatment will depend on what area of the body was injured. In general, the best treatment for a contusion is resting, icing, elevating, and applying cold compresses to the injured area. Over-the-counter medicines may also be recommended for pain control. Ask your caregiver what the best treatment is for your contusion. °HOME CARE INSTRUCTIONS  °· Put ice on the injured area. °¨ Put ice in a plastic bag. °¨ Place a towel between your skin and the bag. °¨ Leave the ice on for 15-20 minutes, 3-4 times a day, or as directed by your health care provider. °· Only take over-the-counter or prescription medicines for pain, discomfort, or fever as directed by your caregiver. Your caregiver may recommend avoiding anti-inflammatory medicines (aspirin, ibuprofen, and naproxen) for 48 hours because these medicines may increase bruising. °· Rest the injured area. °· If possible, elevate the injured area to reduce swelling. °SEEK IMMEDIATE MEDICAL CARE IF:  °· You have increased bruising or swelling. °· You have pain that is getting worse. °· Your swelling or pain is not relieved with medicines. °MAKE SURE YOU:  °· Understand these instructions. °· Will watch your condition. °· Will get help right  away if you are not doing well or get worse. °Document Released: 02/17/2005 Document Revised: 05/15/2013 Document Reviewed: 03/15/2011 °ExitCare® Patient Information ©2015 [employer], LLC. This information is not intended to replace advice given to you by your health care provider. Make sure you discuss any questions you have with your health care provider. ° °
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
## 2014-01-20 NOTE — ED Provider Notes (Signed)
Medical screening examination/treatment/procedure(s) were performed by non-physician practitioner and as supervising physician I was immediately available for consultation/collaboration.    Juanjesus Pepperman, MD 01/20/14 0722 

## 2014-03-28 ENCOUNTER — Emergency Department (HOSPITAL_BASED_OUTPATIENT_CLINIC_OR_DEPARTMENT_OTHER)
Admission: EM | Admit: 2014-03-28 | Discharge: 2014-03-29 | Disposition: A | Discharge status: Home / Self Care | Payer: Medicaid - Out of State | Attending: Emergency Medicine | Admitting: Emergency Medicine

## 2014-03-28 ENCOUNTER — Encounter (HOSPITAL_BASED_OUTPATIENT_CLINIC_OR_DEPARTMENT_OTHER): Payer: Self-pay | Admitting: *Deleted

## 2014-03-28 DIAGNOSIS — J40 Bronchitis, not specified as acute or chronic: Secondary | ICD-10-CM | POA: Insufficient documentation

## 2014-03-28 DIAGNOSIS — J209 Acute bronchitis, unspecified: Secondary | ICD-10-CM

## 2014-03-28 DIAGNOSIS — Z791 Long term (current) use of non-steroidal anti-inflammatories (NSAID): Secondary | ICD-10-CM | POA: Insufficient documentation

## 2014-03-28 DIAGNOSIS — R51 Headache: Secondary | ICD-10-CM | POA: Insufficient documentation

## 2014-03-28 MED ORDER — ALBUTEROL SULFATE HFA 108 (90 BASE) MCG/ACT IN AERS
2.0000 | INHALATION_SPRAY | Freq: Once | RESPIRATORY_TRACT | Status: AC
  Administered 2014-03-29: 2 via RESPIRATORY_TRACT
  Filled 2014-03-28: qty 6.7

## 2014-03-28 MED ORDER — HYDROCOD POLST-CHLORPHEN POLST 10-8 MG/5ML PO LQCR: 5.0000 mL | Freq: Two times a day (BID) | ORAL | Status: DC | PRN

## 2014-03-28 NOTE — ED Notes (Signed)
Cough x 15 days. Headaches. Fever earlier in the week.

## 2014-03-28 NOTE — ED Provider Notes (Signed)
CSN: [id number]     Arrival date & time 03/28/14  2030 History  This chart was scribed for [name], MD by Evon Slackerrance Branch, ED Scribe. This patient was seen in room MH03/MH03 and the patient's care was started at 11:25 PM.     Chief Complaint  Patient presents with  . Cough   Patient is a 23 y.o. female presenting with cough. The history is provided by the patient. No language interpreter was used.  Cough Associated symptoms: headaches    HPI Comments: Leslie Reyes is a 23 y.o. female who presents to the Emergency Department complaining of productive cough with yellow sputum onset 15 days ago. States she has associated headache, worse with coughing. She states she has been taking Mucinex DM with no relief. She states that her symptoms started with a fever to 101.8 at the beginning of her illness; she is no longer having fevers. States she had associated myalgias, nausea, vomiting that have resolved.   History reviewed. No pertinent past medical history. History reviewed. No pertinent past surgical history. No family history on file. History  Substance Use Topics  . Smoking status: Never Smoker   . Smokeless tobacco: Not on file  . Alcohol Use: No   OB History    No data available     Review of Systems  Respiratory: Positive for cough.   Neurological: Positive for headaches.   A complete 10 system review of systems was obtained and all systems are negative except as noted in the HPI and PMH.     Allergies  Review of patient's allergies indicates no known allergies.  Home Medications   Prior to Admission medications   Medication Sig Start Date End Date Taking? Authorizing Provider  chlorpheniramine-HYDROcodone (TUSSIONEX PENNKINETIC ER) 10-8 MG/5ML LQCR Take 5 mLs by mouth every 12 (twelve) hours as needed. 03/28/14   Carlisle BeersJohn L Avary Pitsenbarger, MD  ibuprofen (ADVIL,MOTRIN) 800 MG tablet Take 1 tablet (800 mg total) by mouth 3 (three) times daily. 12/19/13   Monte FantasiaJoseph W Mintz, PA-C   ibuprofen (ADVIL,MOTRIN) 800 MG tablet Take 1 tablet (800 mg total) by mouth 3 (three) times daily. 01/19/14   Elson AreasLeslie K Sofia, PA-C   Triage Vitals: BP 126/104 mmHg  Pulse 67  Temp(Src) 97.9 F (36.6 C) (Oral)  Resp 18  Ht 5\' 8"  (1.727 m)  Wt 380 lb (172.367 kg)  BMI 57.79 kg/m2  SpO2 100%  LMP 03/14/2014  Physical Exam  Nursing note and vitals reviewed.  General: Well-developed, morbidity obese female in no acute distress; appearance consistent with age of record HENT: normocephalic; atraumatic, pharynx normal  Eyes: pupils equal, round and reactive to light; extraocular muscles intact Neck: supple Heart: regular rate and rhythm;  Lungs: clear to auscultation bilaterally, frequent cough  Abdomen: soft; nondistended; nontender; bowel sounds present Extremities: No deformity; full range of motion Neurologic: Awake, alert and oriented; motor function intact in all extremities and symmetric; no facial droop Skin: Warm and dry Psychiatric: Normal mood and affect    ED Course  Procedures (including critical care time) DIAGNOSTIC STUDIES: Oxygen Saturation is 100% on RA, normal by my interpretation.    COORDINATION OF CARE: 11:31 PM-Discussed treatment plan which includes inhaler with pt at bedside and pt agreed to plan.     MDM   Final diagnoses:  Acute bronchitis, unspecified organism   I personally performed the services described in this documentation, which was scribed in my presence. The recorded information has been reviewed and is accurate.  Hanley SeamenJohn L Anny Sayler, MD 03/28/14 [phone]

## 2014-03-28 NOTE — Discharge Instructions (Signed)

## 2014-03-29 NOTE — ED Notes (Signed)
Patient was seen and assessed by the MD and then d/c'd to home.

## 2014-04-28 ENCOUNTER — Emergency Department (HOSPITAL_BASED_OUTPATIENT_CLINIC_OR_DEPARTMENT_OTHER)
Admission: EM | Admit: 2014-04-28 | Discharge: 2014-04-28 | Disposition: A | Discharge status: Home / Self Care | Payer: Medicaid - Out of State | Attending: Emergency Medicine | Admitting: Emergency Medicine

## 2014-04-28 ENCOUNTER — Emergency Department (HOSPITAL_BASED_OUTPATIENT_CLINIC_OR_DEPARTMENT_OTHER): Payer: Medicaid - Out of State

## 2014-04-28 ENCOUNTER — Encounter (HOSPITAL_BASED_OUTPATIENT_CLINIC_OR_DEPARTMENT_OTHER): Payer: Self-pay | Admitting: *Deleted

## 2014-04-28 DIAGNOSIS — S93402A Sprain of unspecified ligament of left ankle, initial encounter: Secondary | ICD-10-CM | POA: Insufficient documentation

## 2014-04-28 DIAGNOSIS — Y9289 Other specified places as the place of occurrence of the external cause: Secondary | ICD-10-CM | POA: Insufficient documentation

## 2014-04-28 DIAGNOSIS — T1490XA Injury, unspecified, initial encounter: Secondary | ICD-10-CM

## 2014-04-28 DIAGNOSIS — Y9389 Activity, other specified: Secondary | ICD-10-CM | POA: Insufficient documentation

## 2014-04-28 DIAGNOSIS — Y998 Other external cause status: Secondary | ICD-10-CM | POA: Insufficient documentation

## 2014-04-28 DIAGNOSIS — Z791 Long term (current) use of non-steroidal anti-inflammatories (NSAID): Secondary | ICD-10-CM | POA: Insufficient documentation

## 2014-04-28 DIAGNOSIS — W1830XA Fall on same level, unspecified, initial encounter: Secondary | ICD-10-CM | POA: Insufficient documentation

## 2014-04-28 MED ORDER — HYDROCODONE-ACETAMINOPHEN 5-325 MG PO TABS: ORAL_TABLET | ORAL | Status: DC

## 2014-04-28 MED ORDER — NAPROXEN 500 MG PO TABS: 500.0000 mg | ORAL_TABLET | Freq: Two times a day (BID) | ORAL | Status: DC

## 2014-04-28 MED ORDER — NAPROXEN 250 MG PO TABS
500.0000 mg | ORAL_TABLET | Freq: Once | ORAL | Status: AC
  Administered 2014-04-28: 500 mg via ORAL
  Filled 2014-04-28: qty 2

## 2014-04-28 MED ORDER — OXYCODONE-ACETAMINOPHEN 5-325 MG PO TABS
1.0000 | ORAL_TABLET | Freq: Once | ORAL | Status: AC
  Administered 2014-04-28: 1 via ORAL
  Filled 2014-04-28: qty 1
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
## 2014-04-28 NOTE — Discharge Instructions (Signed)
Please read and follow all provided instructions.  Your diagnoses today include:  1. Ankle sprain, left, initial encounter   2. Injury    Tests performed today include:  An x-ray of your ankle - does NOT show any broken bones  Vital signs. See below for your results today.   Medications prescribed:   Vicodin (hydrocodone/acetaminophen) - narcotic pain medication  DO NOT drive or perform any activities that require you to be awake and alert because this medicine can make you drowsy. BE VERY CAREFUL not to take multiple medicines containing Tylenol (also called acetaminophen). Doing so can lead to an overdose which can damage your liver and cause liver failure and possibly death.   Naproxen - anti-inflammatory pain medication  Do not exceed 500mg  naproxen every 12 hours, take with food  You have been prescribed an anti-inflammatory medication or NSAID. Take with food. Take smallest effective dose for the shortest duration needed for your pain. Stop taking if you experience stomach pain or vomiting.   Take any prescribed medications only as directed.  Home care instructions:   Follow any educational materials contained in this packet  Follow R.I.C.E. Protocol:  R - rest your injury   I  - use ice on injury without applying directly to skin  C - compress injury with bandage or splint  E - elevate the injury as much as possible  Follow-up instructions: Please follow-up with your primary care provider or the provided orthopedic (bone specialist) if you continue to have significant pain or trouble walking in 1 week. In this case you may have a severe sprain that requires further care.   Return instructions:   Please return if your toes are numb or tingling, appear gray or blue, or you have severe pain (also elevate leg and loosen splint or wrap)  Please return to the Emergency Department if you experience worsening symptoms.   Please return if you have any other emergent  concerns.  Additional Information:  Your vital signs today were: BP 146/95 mmHg   Pulse 92   Temp(Src) 98.3 F (36.8 C) (Oral)   Resp 20   Ht 5\' 8"  (1.727 m)   Wt 380 lb (172.367 kg)   BMI 57.79 kg/m2   SpO2 100%   LMP 04/14/2014 (Approximate) If your blood pressure (BP) was elevated above 135/85 this visit, please have this repeated by your doctor within one month. -------------- Your caregiver has diagnosed you as suffering from an ankle sprain. Ankle sprain occurs when the ligaments that hold the ankle joint together are stretched or torn. It may take 4 to 6 weeks to heal.  For Activity: If prescribed crutches, use crutches with non-weight bearing for the first few days. Then, you may walk on your ankle as the pain allows, or as instructed. Start gradually with weight bearing on the affected ankle. Once you can walk pain free, then try jogging. When you can run forwards, then you can try moving side-to-side. If you cannot walk without crutches in one week, you need a re-check. --------------

## 2014-04-28 NOTE — ED Notes (Signed)
Pt d/c home with friend to drive- ASO and ice pack given for home use- Rx x 2 for bariatric crutches and wheelchair given to pt along with contact number for [facility]- RX for hydrocodone and naprosyn- Note for work given- pt taken to car by wheelchair and transferred self from chair to car

## 2014-04-28 NOTE — ED Notes (Signed)
PA at bedside.

## 2014-04-28 NOTE — ED Notes (Signed)
Patient transported to X-ray 

## 2014-04-28 NOTE — ED Provider Notes (Signed)
CSN: [id number]     Arrival date & time 04/28/14  1642 History   First MD Initiated Contact with Patient 04/28/14 1650     Chief Complaint  Patient presents with  . Ankle Pain     (Consider location/radiation/quality/duration/timing/severity/associated sxs/prior Treatment) HPI Comments: Patient with history of morbid obesity presents with acute onset of left ankle pain beginning after she stepped off a curb and fell, twisting her ankle. Patient was unable to bear weight after the injury. She denies knee pain or hip pain. No treatments prior to arrival. No head or neck injury. No numbness or tingling. Course is constant. Movement of her ankle or trying to walk makes the pain worse.  Patient is a 23 y.o. female presenting with ankle pain. The history is provided by the patient.  Ankle Pain Associated symptoms: no back pain and no neck pain     History reviewed. No pertinent past medical history. History reviewed. No pertinent past surgical history. No family history on file. History  Substance Use Topics  . Smoking status: Never Smoker   . Smokeless tobacco: Not on file  . Alcohol Use: No   OB History    No data available     Review of Systems  Constitutional: Negative for activity change.  Musculoskeletal: Positive for arthralgias. Negative for back pain, joint swelling and neck pain.  Skin: Negative for wound.  Neurological: Negative for weakness and numbness.      Allergies  Review of patient's allergies indicates no known allergies.  Home Medications   Prior to Admission medications   Medication Sig Start Date End Date Taking? Authorizing Provider  chlorpheniramine-HYDROcodone (TUSSIONEX PENNKINETIC ER) 10-8 MG/5ML LQCR Take 5 mLs by mouth every 12 (twelve) hours as needed. 03/28/14   Carlisle BeersJohn L Molpus, MD  ibuprofen (ADVIL,MOTRIN) 800 MG tablet Take 1 tablet (800 mg total) by mouth 3 (three) times daily. 12/19/13   Monte FantasiaJoseph W Mintz, PA-C  ibuprofen (ADVIL,MOTRIN) 800 MG  tablet Take 1 tablet (800 mg total) by mouth 3 (three) times daily. 01/19/14   Elson AreasLeslie K Sofia, PA-C   BP 146/95 mmHg  Pulse 92  Temp(Src) 98.3 F (36.8 C) (Oral)  Resp 20  Ht 5\' 8"  (1.727 m)  Wt 380 lb (172.367 kg)  BMI 57.79 kg/m2  SpO2 100%  LMP 04/14/2014 (Approximate) Physical Exam  Constitutional: She appears well-developed and well-nourished.  HENT:  Head: Normocephalic and atraumatic.  Eyes: Conjunctivae are normal.  Neck: Normal range of motion. Neck supple.  Cardiovascular:  Pulses:      Dorsalis pedis pulses are 2+ on the right side, and 2+ on the left side.       Posterior tibial pulses are 2+ on the right side, and 2+ on the left side.  Musculoskeletal: She exhibits edema and tenderness.       Left ankle: She exhibits decreased range of motion and swelling. Tenderness. Lateral malleolus and medial malleolus tenderness found. No head of 5th metatarsal and no proximal fibula tenderness found.       Left lower leg: Normal.       Left foot: Normal.  Patient complains of pain with palpation of the medial/lateral right/left ankle. She denies pain with palpation over the fibular head of the affected side. She denies pain in the hip of the affected side.  Neurological: She is alert.  Distal motor, sensation, and vascular intact.   Skin: Skin is warm and dry.  Psychiatric: She has a normal mood and affect.  Nursing note  and vitals reviewed.   ED Course  Procedures (including critical care time) Labs Review Labs Reviewed - No data to display  Imaging Review Dg Ankle Complete Left  04/28/2014   CLINICAL DATA:  Larey SeatFell today injuring LEFT ankle, medial and lateral pain with swelling, cannot bear weight  EXAM: LEFT ANKLE COMPLETE - 3+ VIEW  COMPARISON:  None  FINDINGS: Diffuse soft tissue swelling.  Osseous mineralization normal.  Ankle mortise intact.  No acute fracture, dislocation or bone destruction.  Tiny soft tissue calcification versus phlebolith at anterior lower leg.   IMPRESSION: No acute osseous abnormalities.   Electronically Signed   By: Ulyses SouthwardMark  Boles M.D.   On: 04/28/2014 17:36     EKG Interpretation None      6:00 PM Patient seen and examined. Pt informed of x-ray results. She is provided with pain medication and ASO. She is concerned that she cannot bear weight. We do not have bariatric crutches or bariatric walker here. Will provide with rx for these.    Vital signs reviewed and are as follows: BP 146/95 mmHg  Pulse 92  Temp(Src) 98.3 F (36.8 C) (Oral)  Resp 20  Ht 5\' 8"  (1.727 m)  Wt 380 lb (172.367 kg)  BMI 57.79 kg/m2  SpO2 100%  LMP 04/14/2014 (Approximate)   Patient counseled on use of narcotic pain medications. Counseled not to combine these medications with others containing tylenol. Urged not to drink alcohol, drive, or perform any other activities that requires focus while taking these medications. The patient verbalizes understanding and agrees with the plan.    MDM   Final diagnoses:  Ankle sprain, left, initial encounter   Patient with ankle sprain, x-rays are negative. Lower extremity is neurovascularly intact. Normal pulses. No calf tenderness or tightness to suggest compartment syndrome. Patient provided with the above prescriptions to help with mobility and pain control. Orthopedic follow-up indicated in one week if not improved with rest.    Renne CriglerJoshua Khaleem Burchill, PA-C 04/28/14 1814  Derwood KaplanAnkit Nanavati, MD 04/28/14 1821

## 2014-06-12 ENCOUNTER — Emergency Department (HOSPITAL_BASED_OUTPATIENT_CLINIC_OR_DEPARTMENT_OTHER)
Admission: EM | Admit: 2014-06-12 | Discharge: 2014-06-12 | Disposition: A | Discharge status: Home / Self Care | Payer: Medicaid - Out of State | Attending: Emergency Medicine | Admitting: Emergency Medicine

## 2014-06-12 ENCOUNTER — Encounter (HOSPITAL_BASED_OUTPATIENT_CLINIC_OR_DEPARTMENT_OTHER): Payer: Self-pay | Admitting: Emergency Medicine

## 2014-06-12 DIAGNOSIS — H01001 Unspecified blepharitis right upper eyelid: Secondary | ICD-10-CM | POA: Insufficient documentation

## 2014-06-12 DIAGNOSIS — H01004 Unspecified blepharitis left upper eyelid: Secondary | ICD-10-CM | POA: Insufficient documentation

## 2014-06-12 DIAGNOSIS — Z791 Long term (current) use of non-steroidal anti-inflammatories (NSAID): Secondary | ICD-10-CM | POA: Insufficient documentation

## 2014-06-12 DIAGNOSIS — Z792 Long term (current) use of antibiotics: Secondary | ICD-10-CM | POA: Insufficient documentation

## 2014-06-12 DIAGNOSIS — H01003 Unspecified blepharitis right eye, unspecified eyelid: Secondary | ICD-10-CM

## 2014-06-12 DIAGNOSIS — H01006 Unspecified blepharitis left eye, unspecified eyelid: Secondary | ICD-10-CM

## 2014-06-12 MED ORDER — TOBRAMYCIN 0.3 % OP OINT: 1.0000 "application " | TOPICAL_OINTMENT | Freq: Three times a day (TID) | OPHTHALMIC | Status: DC

## 2014-06-12 MED ORDER — ERYTHROMYCIN 5 MG/GM OP OINT: TOPICAL_OINTMENT | OPHTHALMIC | Status: DC

## 2014-06-12 NOTE — ED Notes (Signed)
Pt brought tobramycin prescription back to ED because not covered by insurance and too expensive. Chart reviewed by Dr Linwood DibblesJon Knapp. New Rx received for erythromycin opthalmic ointment, 1/2 inch to both eyelids 3 times a day, 3.5 grams, no refills. Rx called in to [facility] on [address] per pt request. Spoke with pharmacist to check price and relayed to patient who states she can afford it.

## 2014-06-12 NOTE — ED Notes (Signed)
Patient states that she has bilateral eye pain  - left eye x 2 weeks and the right eye x 2 days. Reports that she has burning and irritation to the eye lids, feels that she may have a sty

## 2014-06-12 NOTE — Discharge Instructions (Signed)
Blepharitis Blepharitis is redness, soreness, and swelling (inflammation) of one or both eyelids. It may be caused by an allergic reaction or a bacterial infection. Blepharitis may also be associated with reddened, scaly skin (seborrhea) of the scalp and eyebrows. While you sleep, eye discharge may cause your eyelashes to stick together. Your eyelids may itch, burn, swell, and may lose their lashes. These will grow back. Your eyes may become sensitive. Blepharitis may recur and need repeated treatment. If this is the case, you may require further evaluation by an eye specialist (ophthalmologist). HOME CARE INSTRUCTIONS   Keep your hands clean.  Use a clean towel each time you dry your eyelids. Do not use this towel to clean other areas. Do not share a towel or makeup with anyone.  Wash your eyelids with warm water or warm water mixed with a small amount of baby shampoo. Do this twice a day or as often as needed.  Wash your face and eyebrows at least once a day.  Use warm compresses 2 times a day for 10 minutes at a time, or as directed by your caregiver.  Apply antibiotic ointment as directed by your caregiver.  Avoid rubbing your eyes.  Avoid wearing makeup until you get better.  Follow up with your caregiver as directed. SEEK IMMEDIATE MEDICAL CARE IF:   You have pain, redness, or swelling that gets worse or spreads to other parts of your face.  Your vision changes, or you have pain when looking at lights or moving objects.  You have a fever.  Your symptoms continue for longer than 2 to 4 days or become worse. MAKE SURE YOU:   Understand these instructions.  Will watch your condition.  Will get help right away if you are not doing well or get worse. Document Released: 05/07/2000 Document Revised: 08/02/2011 Document Reviewed: 06/17/2010 Exit[employer] Patient Information 2015 [employer]. This information is not intended to replace advice given to you by your health care  provider. Make sure you discuss any questions you have with your health care provider.  

## 2014-06-12 NOTE — ED Provider Notes (Signed)
CSN: [id number]     Arrival date & time 06/12/14  [id number] History   First MD Initiated Contact with Patient 06/12/14 0710     Chief Complaint  Patient presents with  . Eye Pain     (Consider location/radiation/quality/duration/timing/severity/associated sxs/prior Treatment) HPI Comments: Patient presents with irritation to her eyes. She has some irritation to her left upper eyelid for about 2 weeks. She's noted some crusting of the eyelid and some watery drainage from the eye. About 2 days ago she started noticing that her right eyelid was doing the same thing. She feels like she might have a stye. She does not use makeup but she did note that her sister used  make up about 2 weeks ago to her eyes. She denies any other new exposures. She denies any blisters or rashes. She denies any vision changes. She denies any eye pain or irritation. Irritation is only to the upper eyelids.  Patient is a 24 y.o. female presenting with eye pain.  Eye Pain Pertinent negatives include no headaches.    History reviewed. No pertinent past medical history. History reviewed. No pertinent past surgical history. History reviewed. No pertinent family history. History  Substance Use Topics  . Smoking status: Never Smoker   . Smokeless tobacco: Not on file  . Alcohol Use: No   OB History    No data available     Review of Systems  HENT: Negative for facial swelling.   Eyes: Positive for pain, discharge and itching. Negative for photophobia, redness and visual disturbance.  Gastrointestinal: Negative for nausea and vomiting.  Skin: Negative for rash.  Neurological: Negative for dizziness and headaches.      Allergies  Review of patient's allergies indicates no known allergies.  Home Medications   Prior to Admission medications   Medication Sig Start Date End Date Taking? Authorizing Provider  chlorpheniramine-HYDROcodone (TUSSIONEX PENNKINETIC ER) 10-8 MG/5ML LQCR Take 5 mLs by mouth every 12  (twelve) hours as needed. 03/28/14   Carlisle BeersJohn L Molpus, MD  HYDROcodone-acetaminophen (NORCO/VICODIN) 5-325 MG per tablet Take 1-2 tablets every 6 hours as needed for severe pain 04/28/14   Renne CriglerJoshua Geiple, PA-C  ibuprofen (ADVIL,MOTRIN) 800 MG tablet Take 1 tablet (800 mg total) by mouth 3 (three) times daily. 12/19/13   Monte FantasiaJoseph W Mintz, PA-C  ibuprofen (ADVIL,MOTRIN) 800 MG tablet Take 1 tablet (800 mg total) by mouth 3 (three) times daily. 01/19/14   Elson AreasLeslie K Sofia, PA-C  naproxen (NAPROSYN) 500 MG tablet Take 1 tablet (500 mg total) by mouth 2 (two) times daily. 04/28/14   Renne CriglerJoshua Geiple, PA-C  tobramycin (TOBREX) 0.3 % ophthalmic ointment Place 1 application into both eyes 3 (three) times daily. Apply to both upper eyelids at lid margins 06/12/14   Rolan BuccoMelanie Damyen Knoll, MD   BP 135/84 mmHg  Pulse 89  Temp(Src) 97.8 F (36.6 C) (Oral)  Resp 16  Wt 384 lb (174.181 kg)  SpO2 100%  LMP  (LMP Unknown) Physical Exam  Constitutional: She is oriented to person, place, and time. She appears well-developed and well-nourished.  Eyes: Conjunctivae and EOM are normal. Pupils are equal, round, and reactive to light.  Patient has some mild erythema and crusting of the lid margins of both upper eyelids. No styes are identified. There is no involvement of the eyes. The conjunctiva look normal. There is no drainage from the eyes. No rashes or blisters are noted. No significant swelling of the upper eyelids are noted. There is no signs of periorbital cellulitis  Cardiovascular: Normal rate.   Pulmonary/Chest: Effort normal.  Neurological: She is alert and oriented to person, place, and time.  Skin: Skin is warm and dry.    ED Course  Procedures (including critical care time) Labs Review Labs Reviewed - No data to display  Imaging Review No results found.   EKG Interpretation None      MDM   Final diagnoses:  Blepharitis of both eyes    I gave the patient prescription for some tobramycin ophthalmic ointment  to use to the upper eyelids. I also advised her to use warm compresses. I gave her referral to follow-up with ophthalmology if her symptoms are not improving within the next 2-3 days    Rolan Bucco, MD 06/12/14 [phone]

## 2014-06-12 NOTE — ED Provider Notes (Signed)
Pt came back to the ED. Requested change in abx due to cost.  Erythromycin opthalmic rx called in  Leslie DibblesJon Ansar Skoda, MD 06/12/14 1858

## 2014-06-27 ENCOUNTER — Encounter (HOSPITAL_BASED_OUTPATIENT_CLINIC_OR_DEPARTMENT_OTHER): Payer: Self-pay | Admitting: Emergency Medicine

## 2014-06-27 ENCOUNTER — Emergency Department (HOSPITAL_BASED_OUTPATIENT_CLINIC_OR_DEPARTMENT_OTHER)
Admission: EM | Admit: 2014-06-27 | Discharge: 2014-06-27 | Disposition: A | Discharge status: Home / Self Care | Payer: Medicaid - Out of State | Attending: Emergency Medicine | Admitting: Emergency Medicine

## 2014-06-27 DIAGNOSIS — Z792 Long term (current) use of antibiotics: Secondary | ICD-10-CM | POA: Diagnosis not present

## 2014-06-27 DIAGNOSIS — K088 Other specified disorders of teeth and supporting structures: Secondary | ICD-10-CM | POA: Insufficient documentation

## 2014-06-27 DIAGNOSIS — G8918 Other acute postprocedural pain: Secondary | ICD-10-CM | POA: Insufficient documentation

## 2014-06-27 DIAGNOSIS — Z79899 Other long term (current) drug therapy: Secondary | ICD-10-CM | POA: Diagnosis not present

## 2014-06-27 DIAGNOSIS — Z7952 Long term (current) use of systemic steroids: Secondary | ICD-10-CM | POA: Insufficient documentation

## 2014-06-27 MED ORDER — BUPIVACAINE HCL (PF) 0.5 % IJ SOLN
INTRAMUSCULAR | Status: AC
  Filled 2014-06-27: qty 10

## 2014-06-27 MED ORDER — BUPIVACAINE-EPINEPHRINE (PF) 0.5% -1:200000 IJ SOLN: 10.0000 mL | Freq: Once | INTRAMUSCULAR | Status: AC

## 2014-06-27 MED ORDER — OXYCODONE-ACETAMINOPHEN 5-325 MG PO TABS: 1.0000 | ORAL_TABLET | Freq: Four times a day (QID) | ORAL | Status: DC | PRN

## 2014-06-27 MED ORDER — BUPIVACAINE HCL (PF) 0.5 % IJ SOLN
INTRAMUSCULAR | Status: AC
  Administered 2014-06-27: 50 mg
  Filled 2014-06-27: qty 10

## 2014-06-27 NOTE — ED Notes (Signed)
Pt states she had her wisdom teeth removed on Friday in [place], developed dry socket over the weekend, was seen by dentist Monday and states that he put something into the socket for pain relief so she could return back to [place], states pain never went away and it has been getting progressively worse

## 2014-06-27 NOTE — ED Notes (Signed)
MD at bedside. 

## 2014-06-27 NOTE — ED Notes (Signed)
Pt states she was given prednisone, amoxicillin, Vicodin, but after he filled the tried sockets on Monday was was given percocet and those worked but no longer has any left

## 2014-06-27 NOTE — ED Provider Notes (Signed)
CSN: [id number]     Arrival date & time 06/27/14  0045 History   First MD Initiated Contact with Patient 06/27/14 0057     Chief Complaint  Patient presents with  . Dental Pain     (Consider location/radiation/quality/duration/timing/severity/associated sxs/prior Treatment) HPI  This is a 24 year old female who had her wisdom teeth removed 6 days ago in [place]. She developed postoperative pain and had to be seen 3 days later. She states she was diagnosed with bilateral mandibular dry sockets and she had something placed in the sockets to ease the pain. The dentist gave her local anesthesia and she was able to drive back here that day. She continues to have pain at those sites which she rates as a 10 out of 10. The pain radiates up into her face bilaterally. She was given a prescription for Vicodin but this does not eased the pain. She does get some relief with Percocet but has run out of these. She is also currently on an antibiotic and was given a steroid postoperatively which she has completed. She is not aware of having a fever.  History reviewed. No pertinent past medical history. Past Surgical History  Procedure Laterality Date  . Wisdom teeth removal     History reviewed. No pertinent family history. History  Substance Use Topics  . Smoking status: Never Smoker   . Smokeless tobacco: Not on file  . Alcohol Use: No   OB History    No data available     Review of Systems  All other systems reviewed and are negative.   Allergies  Review of patient's allergies indicates no known allergies.  Home Medications   Prior to Admission medications   Medication Sig Start Date End Date Taking? Authorizing Provider  amoxicillin (AMOXIL) 875 MG tablet Take 875 mg by mouth 2 (two) times daily.   Yes Historical Provider, MD  HYDROcodone-acetaminophen (NORCO/VICODIN) 5-325 MG per tablet Take 1-2 tablets every 6 hours as needed for severe pain 04/28/14  Yes Renne Crigler, PA-C   predniSONE (STERAPRED UNI-PAK) 10 MG tablet Take by mouth daily.   Yes Historical Provider, MD  chlorpheniramine-HYDROcodone (TUSSIONEX PENNKINETIC ER) 10-8 MG/5ML LQCR Take 5 mLs by mouth every 12 (twelve) hours as needed. 03/28/14   Carlisle Beers Dashae Wilcher, MD  erythromycin ophthalmic ointment Place a 1/2 inch ribbon of ointment into the eyelid. 06/12/14   Linwood Dibbles, MD  ibuprofen (ADVIL,MOTRIN) 800 MG tablet Take 1 tablet (800 mg total) by mouth 3 (three) times daily. 12/19/13   Monte Fantasia, PA-C  ibuprofen (ADVIL,MOTRIN) 800 MG tablet Take 1 tablet (800 mg total) by mouth 3 (three) times daily. 01/19/14   Elson Areas, PA-C  naproxen (NAPROSYN) 500 MG tablet Take 1 tablet (500 mg total) by mouth 2 (two) times daily. 04/28/14   Renne Crigler, PA-C  tobramycin (TOBREX) 0.3 % ophthalmic ointment Place 1 application into both eyes 3 (three) times daily. Apply to both upper eyelids at lid margins 06/12/14   Rolan Bucco, MD   BP 145/100 mmHg  Pulse 98  Temp(Src) 98.3 F (36.8 C) (Oral)  Resp 22  Ht  (1.727 m)  Wt 385 lb (174.635 kg)  BMI 58.55 kg/m2  SpO2 100%  LMP 06/07/2014   Physical Exam  General: Well-developed, morbidly obese female in no acute distress; appearance consistent with age of record HENT: normocephalic; atraumatic; bilateral upper and lower third molar extraction sites, sutured closed, no erythema or purulent drainage seen Eyes: Normal appearance Neck:  supple; no lymphadenopathy Heart: regular rate and rhythm Lungs: Normal respiratory effort and excursion Abdomen: soft; obese Extremities: No deformity; full range of motion Neurologic: Awake, alert and oriented; motor function intact in all extremities and symmetric; no facial droop Skin: Warm and dry Psychiatric: Normal mood and affect    ED Course  Procedures (including critical care time)  DENTAL BLOCK 2 milliliters of 0.5% bupivacaine without epinephrine was injected into each of the buccal folds adjacent to the  mandibular third molar extraction sockets. The patient tolerated this well and there were no immediate complications. Adequate analgesia achieved.  MDM     Hanley SeamenJohn L Lakevia Perris, MD 06/27/14 [phone]

## 2015-01-17 ENCOUNTER — Emergency Department (HOSPITAL_COMMUNITY)
Admission: EM | Admit: 2015-01-17 | Discharge: 2015-01-17 | Disposition: A | Discharge status: Home / Self Care | Payer: Commercial Managed Care - HMO | Attending: Emergency Medicine | Admitting: Emergency Medicine

## 2015-01-17 ENCOUNTER — Encounter (HOSPITAL_COMMUNITY): Payer: Self-pay | Admitting: Emergency Medicine

## 2015-01-17 DIAGNOSIS — H2000 Unspecified acute and subacute iridocyclitis: Secondary | ICD-10-CM | POA: Insufficient documentation

## 2015-01-17 DIAGNOSIS — H209 Unspecified iridocyclitis: Secondary | ICD-10-CM

## 2015-01-17 DIAGNOSIS — H5713 Ocular pain, bilateral: Secondary | ICD-10-CM

## 2015-01-17 DIAGNOSIS — Z7952 Long term (current) use of systemic steroids: Secondary | ICD-10-CM | POA: Insufficient documentation

## 2015-01-17 DIAGNOSIS — Z791 Long term (current) use of non-steroidal anti-inflammatories (NSAID): Secondary | ICD-10-CM | POA: Diagnosis not present

## 2015-01-17 DIAGNOSIS — H578 Other specified disorders of eye and adnexa: Secondary | ICD-10-CM | POA: Diagnosis present

## 2015-01-17 MED ORDER — FLUORESCEIN SODIUM 1 MG OP STRP: 1.0000 | ORAL_STRIP | Freq: Once | OPHTHALMIC | Status: DC

## 2015-01-17 MED ORDER — TETRACAINE HCL 0.5 % OP SOLN: 2.0000 [drp] | Freq: Once | OPHTHALMIC | Status: DC

## 2015-01-17 MED ORDER — CYCLOPENTOLATE HCL 1 % OP SOLN
2.0000 [drp] | Freq: Once | OPHTHALMIC | Status: AC
  Administered 2015-01-17: 2 [drp] via OPHTHALMIC
  Filled 2015-01-17: qty 2

## 2015-01-17 MED ORDER — FLUORESCEIN SODIUM 1 MG OP STRP
2.0000 | ORAL_STRIP | Freq: Once | OPHTHALMIC | Status: AC
  Administered 2015-01-17: 2 via OPHTHALMIC
  Filled 2015-01-17: qty 2

## 2015-01-17 MED ORDER — TETRACAINE HCL 0.5 % OP SOLN
2.0000 [drp] | Freq: Once | OPHTHALMIC | Status: AC
  Administered 2015-01-17: 2 [drp] via OPHTHALMIC
  Filled 2015-01-17: qty 2

## 2015-01-17 NOTE — ED Notes (Signed)
Pt states she woke up with a burning sensation in both eyes  Pt states it hurts to open her eyes and the light makes the pain worse  Pt states she was fine last night when she went to bed  Denies any new facial cream or makeup

## 2015-01-17 NOTE — ED Notes (Signed)
Per EMS pt states she woke up this morning and could not see  Pt was sitting in her car when EMS arrived with a wash cloth over her eyes

## 2015-01-17 NOTE — ED Notes (Signed)
PA at bedside.

## 2015-01-17 NOTE — ED Provider Notes (Signed)
CSN: [id number]     Arrival date & time 01/17/15  0503 History   First MD Initiated Contact with Patient 01/17/15 [phone]     Chief Complaint  Patient presents with  . Eye Problem     (Consider location/radiation/quality/duration/timing/severity/associated sxs/prior Treatment) Patient is a 24 y.o. female presenting with eye problem. The history is provided by the patient. No language interpreter was used.  Eye Problem Location:  Both Quality:  Tearing, burning, stinging and aching Severity:  Moderate Onset quality:  Sudden Duration:  3 hours Timing:  Constant Progression:  Unchanged Chronicity:  New Context: contact lenses   Context: not burn, not chemical exposure, not direct trauma, not foreign body, not using machinery, not scratch, not smoke exposure and not tanning booth use   Relieved by:  Nothing Worsened by:  Bright light Ineffective treatments:  None tried Associated symptoms: blurred vision, photophobia and tearing   Associated symptoms: no crusting, no decreased vision, no discharge, no double vision, no facial rash, no foreign body sensation, no headaches, no inflammation, no itching, no nausea, no numbness, no redness, no scotomas, no swelling, no tingling, no vomiting and no weakness   Risk factors: no conjunctival hemorrhage, not exposed to pinkeye, no previous injury to eye, no recent herpes zoster and no recent URI     History reviewed. No pertinent past medical history. Past Surgical History  Procedure Laterality Date  . Wisdom teeth removal     History reviewed. No pertinent family history. Social History  Substance Use Topics  . Smoking status: Never Smoker   . Smokeless tobacco: None  . Alcohol Use: No   OB History    No data available     Review of Systems  HENT: Negative for dental problem and facial swelling.   Eyes: Positive for blurred vision, photophobia and visual disturbance (blurry). Negative for double vision, discharge, redness and itching.   Gastrointestinal: Negative for nausea and vomiting.  Musculoskeletal: Negative for neck pain and neck stiffness.  Skin: Negative for rash.  Neurological: Negative for tingling, weakness, numbness and headaches.      Allergies  Review of patient's allergies indicates no known allergies.  Home Medications   Prior to Admission medications   Medication Sig Start Date End Date Taking? Authorizing Provider  naproxen sodium (ANAPROX) 220 MG tablet Take 220 mg by mouth 2 (two) times daily with a meal.   Yes Historical Provider, MD  chlorpheniramine-HYDROcodone (TUSSIONEX PENNKINETIC ER) 10-8 MG/5ML LQCR Take 5 mLs by mouth every 12 (twelve) hours as needed. Patient not taking: Reported on 01/17/2015 03/28/14   Paula Libra, MD  erythromycin ophthalmic ointment Place a 1/2 inch ribbon of ointment into the eyelid. Patient not taking: Reported on 01/17/2015 06/12/14   Linwood Dibbles, MD  HYDROcodone-acetaminophen (NORCO/VICODIN) 5-325 MG per tablet Take 1-2 tablets every 6 hours as needed for severe pain Patient not taking: Reported on 01/17/2015 04/28/14   Renne Crigler, PA-C  ibuprofen (ADVIL,MOTRIN) 800 MG tablet Take 1 tablet (800 mg total) by mouth 3 (three) times daily. Patient not taking: Reported on 01/17/2015 12/19/13   Ladona Mow, PA-C  ibuprofen (ADVIL,MOTRIN) 800 MG tablet Take 1 tablet (800 mg total) by mouth 3 (three) times daily. Patient not taking: Reported on 01/17/2015 01/19/14   Elson Areas, PA-C  naproxen (NAPROSYN) 500 MG tablet Take 1 tablet (500 mg total) by mouth 2 (two) times daily. Patient not taking: Reported on 01/17/2015 04/28/14   Renne Crigler, PA-C  oxyCODONE-acetaminophen (PERCOCET/ROXICET) 5-325 MG per tablet  Take 1-2 tablets by mouth every 6 (six) hours as needed for severe pain (for pain). Patient not taking: Reported on 01/17/2015 06/27/14   Paula Libra, MD  predniSONE (STERAPRED UNI-PAK) 10 MG tablet Take by mouth daily.    Historical Provider, MD  tobramycin (TOBREX) 0.3 %  ophthalmic ointment Place 1 application into both eyes 3 (three) times daily. Apply to both upper eyelids at lid margins Patient not taking: Reported on 01/17/2015 06/12/14   Rolan Bucco, MD   BP 132/70 mmHg  Pulse 72  Temp(Src) 98.1 F (36.7 C) (Oral)  Resp 20  SpO2 100% Physical Exam  Constitutional: She is oriented to person, place, and time. She appears well-developed and well-nourished. No distress.  HENT:  Head: Normocephalic and atraumatic.  Eyes: Conjunctivae, EOM and lids are normal. Pupils are equal, round, and reactive to light. Lids are everted and swept, no foreign bodies found. Right eye exhibits no chemosis, no discharge and no exudate. No foreign body present in the right eye. Left eye exhibits no chemosis, no discharge and no exudate. No foreign body present in the left eye. Right conjunctiva is not injected. Right conjunctiva has no hemorrhage. Left conjunctiva is not injected. Left conjunctiva has no hemorrhage. No scleral icterus.  Slit lamp exam:      The right eye shows no corneal abrasion, no corneal flare, no corneal ulcer and no fluorescein uptake.       The left eye shows no corneal abrasion, no corneal flare, no corneal ulcer and no fluorescein uptake.  No fluorescein uptake Pressure OD: 25 mmHg OS: 20 mmHg Stinging relieved with tetracaine, however +for continued photophobia  Neck: Normal range of motion.  Cardiovascular: Normal rate, regular rhythm and normal heart sounds.  Exam reveals no gallop and no friction rub.   No murmur heard. Pulmonary/Chest: Effort normal and breath sounds normal. No respiratory distress.  Abdominal: Soft. Bowel sounds are normal. She exhibits no distension and no mass. There is no tenderness. There is no guarding.  Neurological: She is alert and oriented to person, place, and time.  Skin: Skin is warm and dry. She is not diaphoretic.  Nursing note and vitals reviewed.   ED Course  Procedures (including critical care  time) Labs Review Labs Reviewed - No data to display  Imaging Review No results found. I have personally reviewed and evaluated these images and lab results as part of my medical decision-making.   EKG Interpretation None      MDM   Final diagnoses:  Eye pain, bilateral  Uveitis    6:44 AM BP 132/70 mmHg  Pulse 72  Temp(Src) 98.1 F (36.7 C) (Oral)  Resp 20  SpO2 100% Pateint presents with BL eye pain and photophobia wince 3 am Pain woke her form sleep. +associated watering, blurry vision, burning No crusting or mattering No discharge, chemical exposure, injuries She does work outside in the sunlight and denies using dark glasses. Never had anything like this before.  She wears contacts but has not warn the in the past 4 days.  Exam shows to abrasions, ulcerations, cell or flare I feel the patient likely has BL iritis. Will treat with cyclopentolate Refer to ophthalmology.       Arthor Captain, PA-C 01/17/15 1041

## 2015-01-17 NOTE — ED Notes (Signed)
Warm blankets given.

## 2015-01-17 NOTE — ED Notes (Signed)
Patient states she can't do visual acuity testing because " i can't see anything, its all blurry".  Reported patients comments to lisa-rn.

## 2015-01-17 NOTE — Discharge Instructions (Signed)
Iritis Iritis is an inflammation of the colored part of the eye (iris). Other parts at the front of the eye may also be inflamed. The iris is part of the middle layer of the eyeball which is called the uvea or the uveal track. Any part of the uveal track can become inflamed. The other portions of the uveal track are the choroid (the thin membrane under the outer layer of the eye), and the ciliary body (joins the choroid and the iris and produces the fluid in the front of the eye).  It is extremely important to treat iritis early, as it may lead to internal eye damage causing scarring or diseases such as glaucoma. Some people have only one attack of iritis (in one or both eyes) in their lifetime, while others may get it many times. CAUSES Iritis can be associated with many different diseases, but mostly occurs in otherwise healthy people. Examples of diseases that can be associated with iritis include:  Diseases where the body's immune system attacks tissues within your own body (autoimmune diseases).  Infections (tuberculosis, gonorrhea, fungus infections, Lyme disease, infection of the lining of the heart).  Trauma or injury.  Eye diseases (acute glaucoma and others).  Inflammation from other parts of the uveal track.  Severe eye infections.  Other rare diseases. SYMPTOMS  Eye pain or aching.  Sensitivity to light.  Loss of sight or blurred vision.  Redness of the eye. This is often accompanied by a ring of redness around the outside of the cornea, or clear covering at the front of the eye (ciliary flush).  Excessive tearing of the eye(s).  A small pupil that does not enlarge in the dark and stays smaller than the other eye's pupil.  A whitish area that obscures the lower part of the colored circular iris. Sometimes this is visible when looking at the eye, where the whitish area has a "fluid level" or flat top. This is called a "hypopyon" and is actually pus inside the eye. Since  iritis causes the eye to become red, it is often confused with a much less dangerous form of "pink eye" or conjunctivitis. One of the most important symptoms is sensitivity to light. Anytime there is redness, discomfort in the eye(s) and extreme light sensitivity, it is extremely important to see an ophthalmologist as soon as possible. TREATMENT Acute iritis requires prompt medical evaluation by an eye specialist (ophthalmologist.) Treatment depends on the underlying cause but may include:  Corticosteroid eye drops and dilating eye drops. Follow your caregiver's exact instructions on taking and stopping corticosteroid medications (drops or pills).  Occasionally, the iritis will be so severe that it will not respond to commonly used medications. If this happens, it may be necessary to use steroid injections. The injections are given under the eye's outer surface. Sometimes oral medications are given. The decision on treatment used for iritis is usually made on an individual basis. HOME CARE INSTRUCTIONS Your care giver will give specific instructions regarding the use of eye medications or other medications. Be certain to follow all instructions in both taking and stopping the medications. SEEK IMMEDIATE MEDICAL CARE IF:  You have redness of one or both eye.  You experience a great deal of light sensitivity.  You have pain or aching in either eye. MAKE SURE YOU:   Understand these instructions.  Will watch your condition.  Will get help right away if you are not doing well or get worse. Document Released: 05/10/2005 Document Revised: 08/02/2011 Document   Reviewed: 10/28/2006 Exit[employer] Patient Information 2015 [employer]. This information is not intended to replace advice given to you by your health care provider. Make sure you discuss any questions you have with your health care provider.  

## 2015-01-17 NOTE — ED Provider Notes (Signed)
Medical screening examination/treatment/procedure(s) were conducted as a shared visit with non-physician practitioner(s) and myself.  I personally evaluated the patient during the encounter.   EKG Interpretation None      Pt is a 24 y.o. obese female who presents to the emergency department with bilateral eye pain, burning, watering that started at 3 AM. Wears contacts intermittently. Denies any new exposures. On exam, patient has normal pupillary response but has significant photophobia. Pain has improved with tetracaine. Pressure in the right eye is 25, pressure in the left eye is 20. No corneal abrasion, ulcer. Suspect iritis. Have advised her to avoid using contacts and use her glasses. Discussed with patient no driving or work until symptoms have resolved. Have recommended close ophthalmology follow-up.  Layla Maw Jermya Dowding, DO 01/17/15 [phone]

## 2015-02-27 ENCOUNTER — Other Ambulatory Visit (HOSPITAL_COMMUNITY)
Admission: RE | Admit: 2015-02-27 | Discharge: 2015-02-27 | Disposition: A | Discharge status: Home / Self Care | Payer: Commercial Managed Care - HMO | Source: Ambulatory Visit | Attending: Obstetrics and Gynecology | Admitting: Obstetrics and Gynecology

## 2015-02-27 ENCOUNTER — Other Ambulatory Visit: Payer: Self-pay | Admitting: Obstetrics & Gynecology

## 2015-02-27 DIAGNOSIS — Z01419 Encounter for gynecological examination (general) (routine) without abnormal findings: Secondary | ICD-10-CM | POA: Insufficient documentation

## 2015-03-03 LAB — CYTOLOGY - PAP

## 2015-04-06 IMAGING — CR DG KNEE COMPLETE 4+V*R*
4 series · 4 of 4 positions shown · non-contrast
Comparison: None.

CLINICAL DATA: Diffuse knee pain following injury 2 weeks ago.

EXAM:
RIGHT KNEE - COMPLETE 4+ VIEW

[t knee ap right]
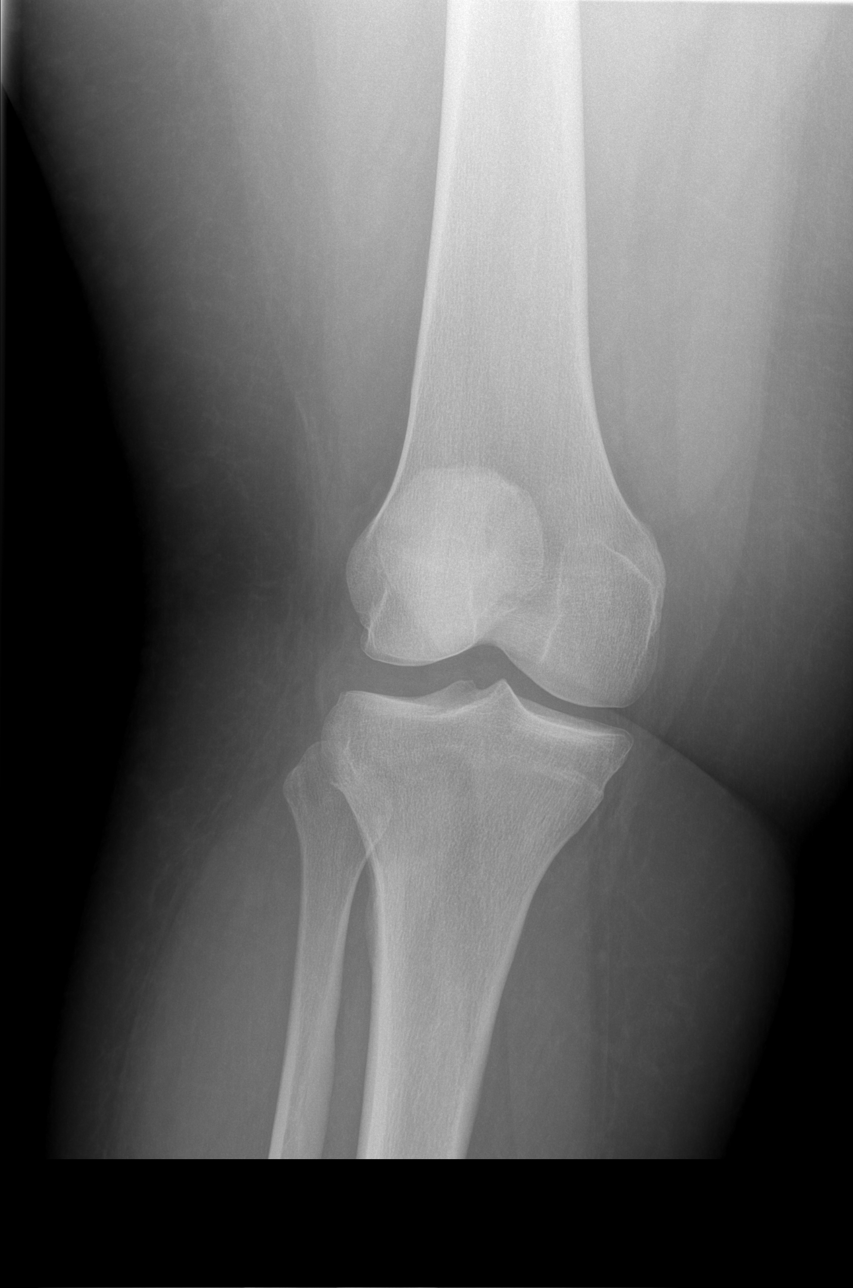

[t knee oblique right]
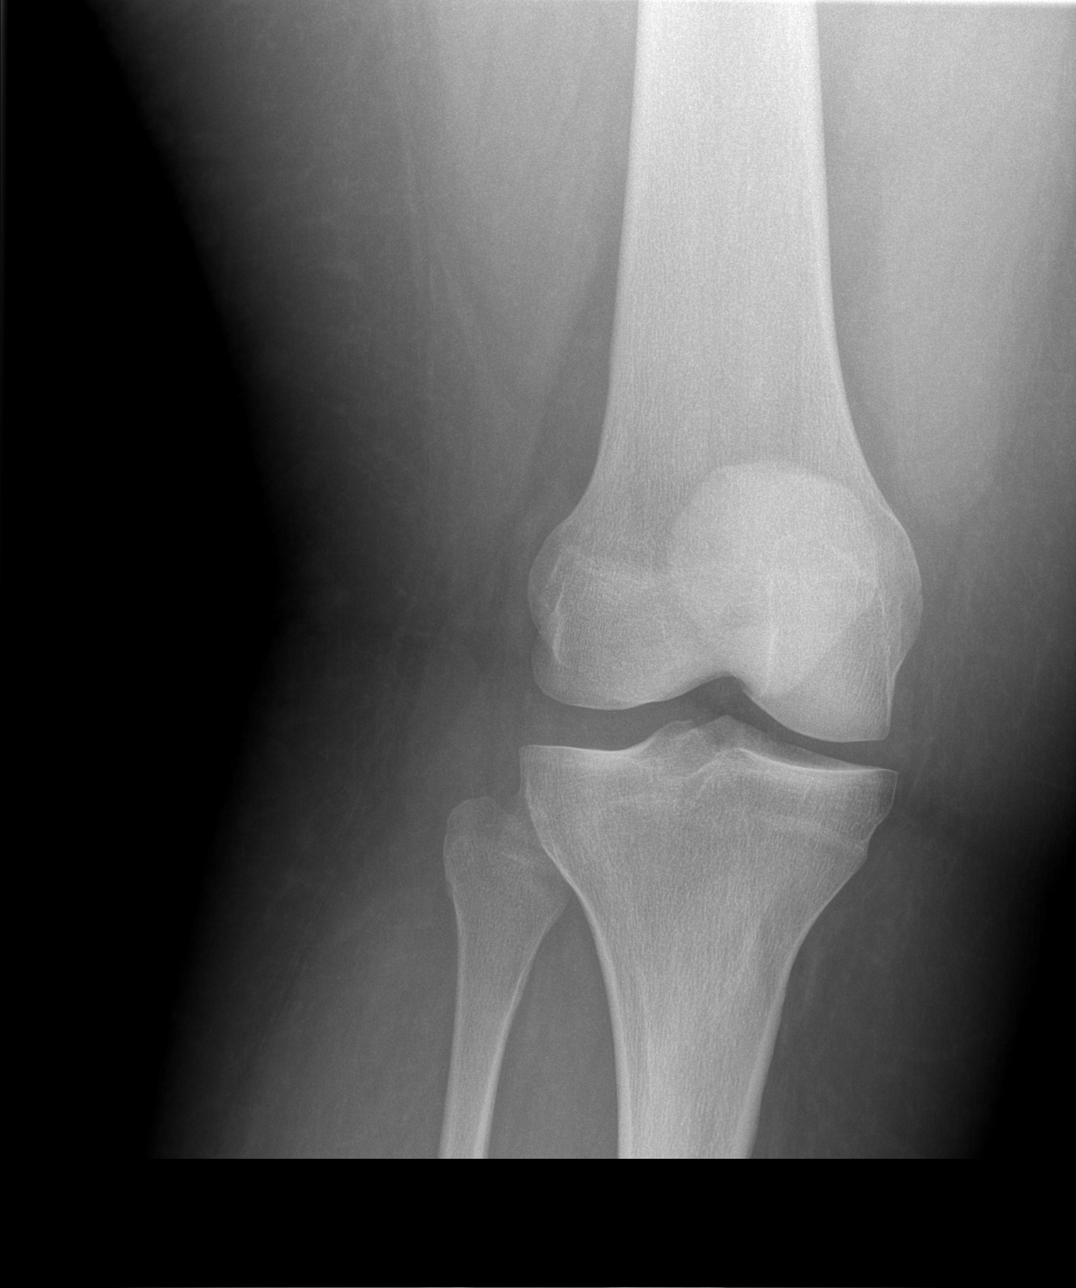

[t knee lat right (1 of 2)]
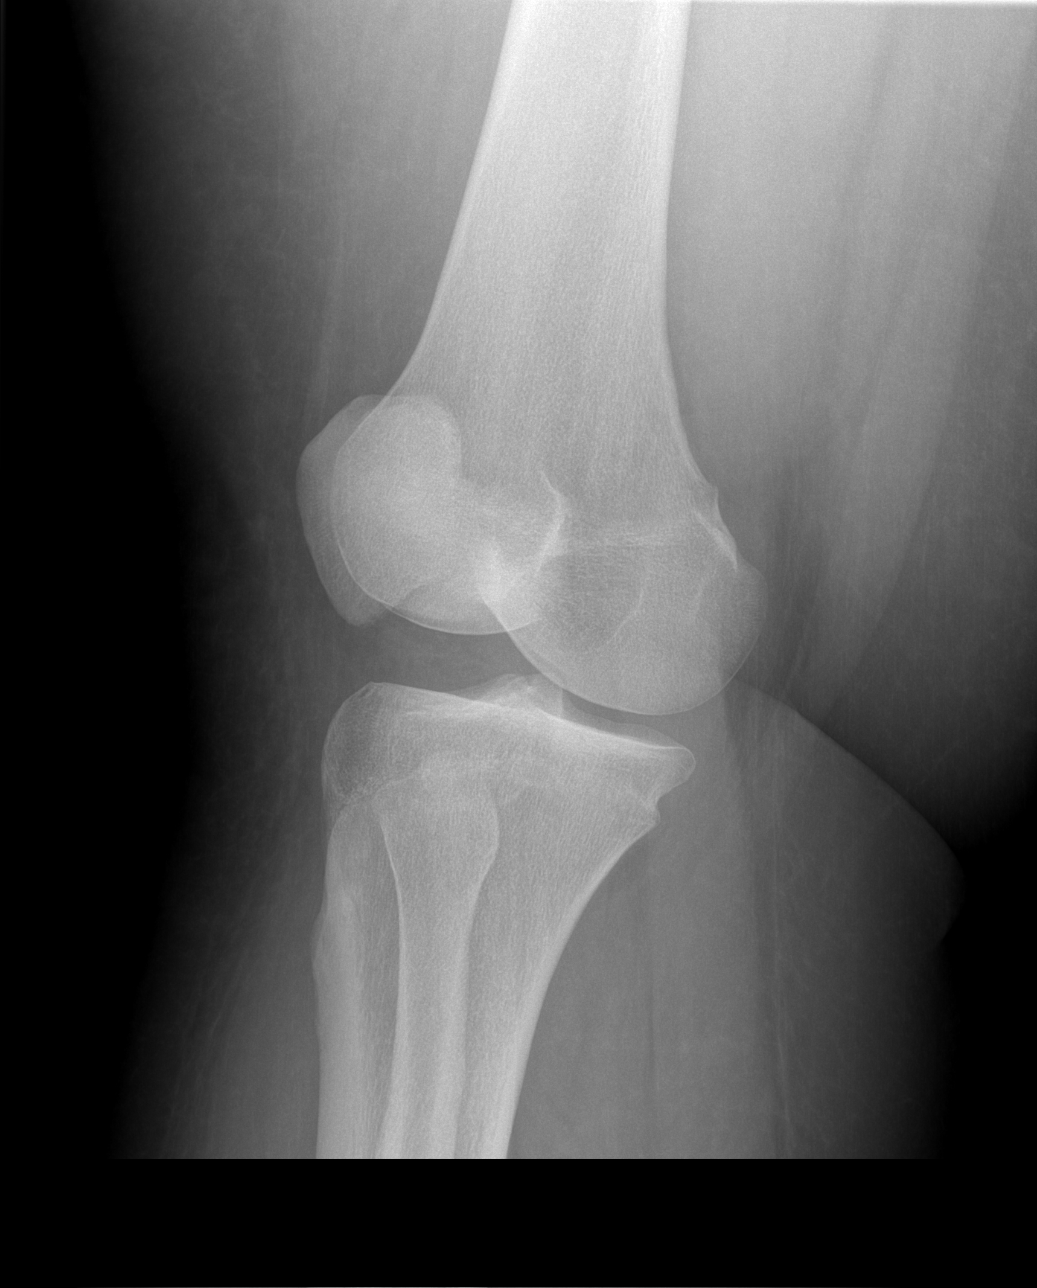

[t knee lat right (2 of 2)]
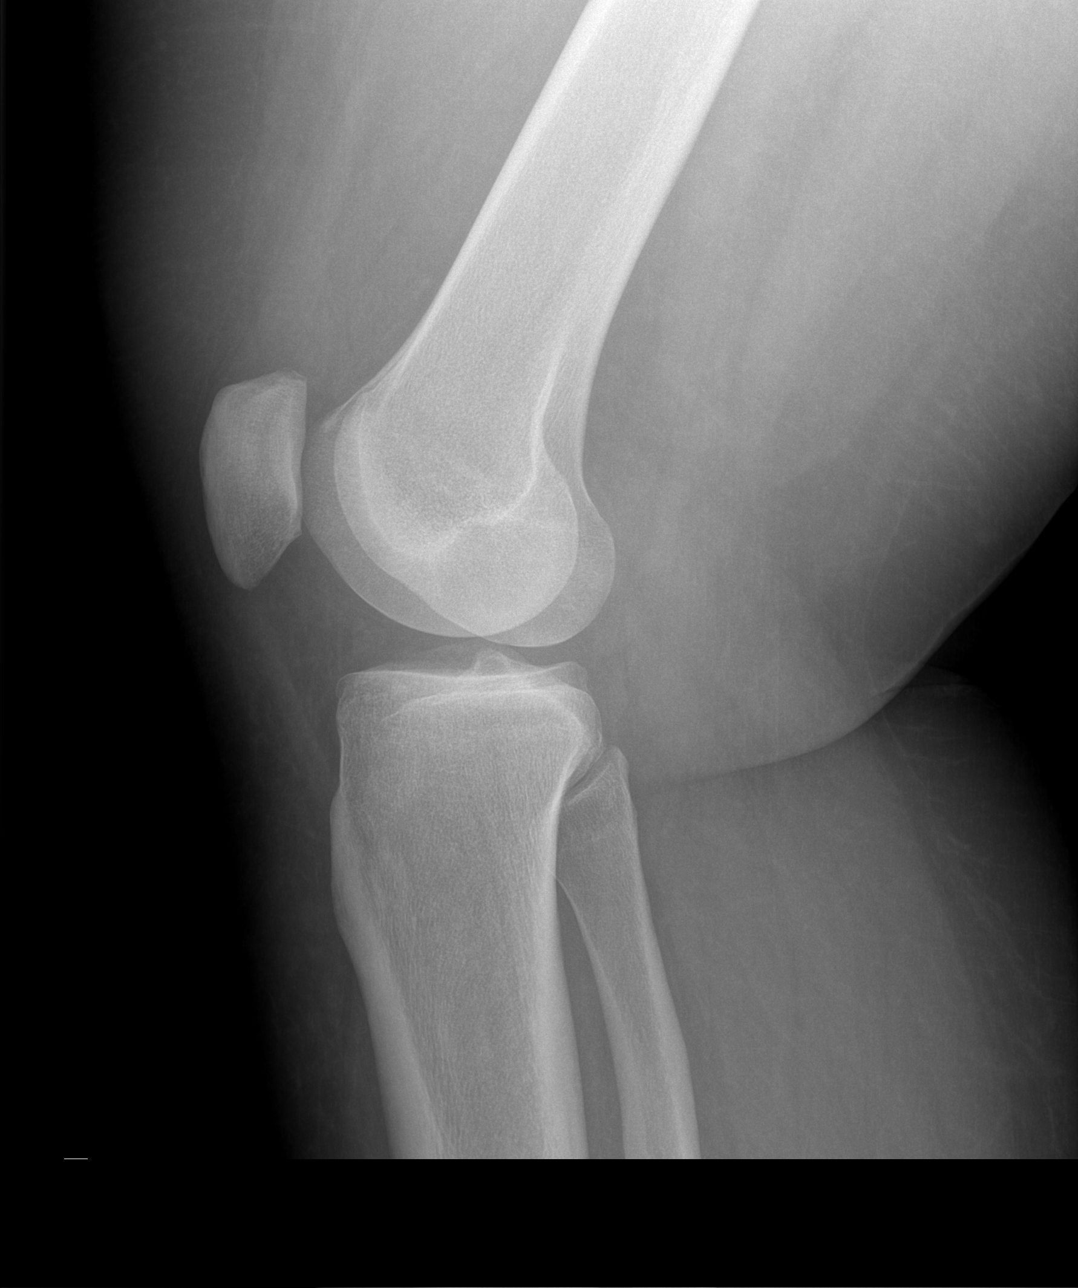

[4 of 4 positions shown; findings below may reference images not displayed]

FINDINGS: The mineralization and alignment are normal. There is no evidence of
acute fracture or dislocation. The joint spaces are maintained.
There is a possible small knee joint effusion.
IMPRESSION: No acute osseous findings.  Possible small joint effusion.

## 2015-05-20 ENCOUNTER — Emergency Department (HOSPITAL_BASED_OUTPATIENT_CLINIC_OR_DEPARTMENT_OTHER)
Admission: EM | Admit: 2015-05-20 | Discharge: 2015-05-20 | Disposition: A | Discharge status: Home / Self Care | Payer: Commercial Managed Care - HMO | Attending: Emergency Medicine | Admitting: Emergency Medicine

## 2015-05-20 ENCOUNTER — Encounter (HOSPITAL_BASED_OUTPATIENT_CLINIC_OR_DEPARTMENT_OTHER): Payer: Self-pay | Admitting: *Deleted

## 2015-05-20 DIAGNOSIS — M25561 Pain in right knee: Secondary | ICD-10-CM | POA: Diagnosis not present

## 2015-05-20 DIAGNOSIS — Z791 Long term (current) use of non-steroidal anti-inflammatories (NSAID): Secondary | ICD-10-CM | POA: Insufficient documentation

## 2015-05-20 DIAGNOSIS — M25562 Pain in left knee: Secondary | ICD-10-CM | POA: Insufficient documentation

## 2015-05-20 DIAGNOSIS — R350 Frequency of micturition: Secondary | ICD-10-CM | POA: Insufficient documentation

## 2015-05-20 DIAGNOSIS — Z792 Long term (current) use of antibiotics: Secondary | ICD-10-CM | POA: Insufficient documentation

## 2015-05-20 LAB — URINALYSIS, ROUTINE W REFLEX MICROSCOPIC
BILIRUBIN URINE: NEGATIVE
GLUCOSE, UA: NEGATIVE mg/dL
HGB URINE DIPSTICK: NEGATIVE
KETONES UR: NEGATIVE mg/dL
Leukocytes, UA: NEGATIVE
Nitrite: NEGATIVE
PROTEIN: NEGATIVE mg/dL
Specific Gravity, Urine: 1.026 (ref 1.005–1.030)
pH: 5.5 (ref 5.0–8.0)

## 2015-05-20 LAB — CBG MONITORING, ED
Glucose-Capillary: 67 mg/dL (ref 65–99)
Glucose-Capillary: 104 mg/dL — ABNORMAL HIGH (ref 65–99)

## 2015-05-20 LAB — PREGNANCY, URINE: PREG TEST UR: NEGATIVE

## 2015-05-20 MED ORDER — NAPROXEN 500 MG PO TABS: 500.0000 mg | ORAL_TABLET | Freq: Two times a day (BID) | ORAL | Status: DC

## 2015-05-20 NOTE — ED Notes (Signed)
Pt c/o bil knee pain x weeks. Also c/o freq urination

## 2015-05-20 NOTE — Discharge Instructions (Signed)
Urinary Frequency The number of times a normal person urinates depends upon how much liquid they take in and how much liquid they are losing. If the temperature is hot and there is high humidity, then the person will sweat more and usually breathe a little more frequently. These factors decrease the amount of frequency of urination that would be considered normal. The amount you drink is easily determined, but the amount of fluid lost is sometimes more difficult to calculate.  Fluid is lost in two ways:  Sensible fluid loss is usually measured by the amount of urine that you get rid of. Losses of fluid can also occur with diarrhea.  Insensible fluid loss is more difficult to measure. It is caused by evaporation. Insensible loss of fluid occurs through breathing and sweating. It usually ranges from a little less than a quart to a little more than a quart of fluid a day. In normal temperatures and activity levels, the average person may urinate 4 to 7 times in a 24-hour period. Needing to urinate more often than that could indicate a problem. If one urinates 4 to 7 times in 24 hours and has large volumes each time, that could indicate a different problem from one who urinates 4 to 7 times a day and has small volumes. The time of urinating is also important. Most urinating should be done during the waking hours. Getting up at night to urinate frequently can indicate some problems. CAUSES  The bladder is the organ in your lower abdomen that holds urine. Like a balloon, it swells some as it fills up. Your nerves sense this and tell you it is time to head for the bathroom. There are a number of reasons that you might feel the need to urinate more often than usual. They include:  Urinary tract infection. This is usually associated with other signs such as burning when you urinate.  In men, problems with the prostate (a walnut-size gland that is located near the tube that carries urine out of your body). There  are two reasons why the prostate can cause an increased frequency of urination:  An enlarged prostate that does not let the bladder empty well. If the bladder only half empties when you urinate, then it only has half the capacity to fill before you have to urinate again.  The nerves in the bladder become more hypersensitive with an increased size of the prostate even if the bladder empties completely.  Pregnancy.  Obesity. Excess weight is more likely to cause a problem for women than for men.  Bladder stones or other bladder problems.  Caffeine.  Alcohol.  Medications. For example, drugs that help the body get rid of extra fluid (diuretics) increase urine production. Some other medicines must be taken with lots of fluids.  Muscle or nerve weakness. This might be the result of a spinal cord injury, a stroke, multiple sclerosis, or Parkinson disease.  Long-standing diabetes can decrease the sensation of the bladder. This loss of sensation makes it harder to sense the bladder needs to be emptied. Over a period of years, the bladder is stretched out by constant overfilling. This weakens the bladder muscles so that the bladder does not empty well and has less capacity to fill with new urine.  Interstitial cystitis (also called painful bladder syndrome). This condition develops because the tissues that line the inside of the bladder are inflamed (inflammation is the body's way of reacting to injury or infection). It causes pain and frequent  urination. It occurs in women more often than in men. DIAGNOSIS   To decide what might be causing your urinary frequency, your health care provider will probably:  Ask about symptoms you have noticed.  Ask about your overall health. This will include questions about any medications you are taking.  Do a physical examination.  Order some tests. These might include:  A blood test to check for diabetes or other health issues that could be contributing  to the problem.  Urine testing. This could measure the flow of urine and the pressure on the bladder.  A test of your neurological system (the brain, spinal cord, and nerves). This is the system that senses the need to urinate.  A bladder test to check whether it is emptying completely when you urinate.  Cystoscopy. This test uses a thin tube with a tiny camera on it. It offers a look inside your urethra and bladder to see if there are problems.  Imaging tests. You might be given a contrast dye and then asked to urinate. X-rays are taken to see how your bladder is working. TREATMENT  It is important for you to be evaluated to determine if the amount or frequency that you have is unusual or abnormal. If it is found to be abnormal, the cause should be determined and this can usually be found out easily. Depending upon the cause, treatment could include medication, stimulation of the nerves, or surgery. There are not too many things that you can do as an individual to change your urinary frequency. It is important that you balance the amount of fluid intake needed to compensate for your activity and the temperature. Medical problems will be diagnosed and taken care of by your physician. There is no particular bladder training such as Kegel exercises that you can do to help urinary frequency. This is an exercise that is usually recommended for people who have leaking of urine when they laugh, cough, or sneeze. HOME CARE INSTRUCTIONS   Take any medications your health care provider prescribed or suggested. Follow the directions carefully.  Practice any lifestyle changes that are recommended. These might include:  Drinking less fluid or drinking at different times of the day. If you need to urinate often during the night, for example, you may need to stop drinking fluids early in the evening.  Cutting down on caffeine or alcohol. They both can make you need to urinate more often than normal. Caffeine  is found in coffee, tea, and sodas.  Losing weight, if that is recommended.  Keep a journal or a log. You might be asked to record how much you drink and when and where you feel the need to urinate. This will also help evaluate how well the treatment provided by your physician is working. SEEK MEDICAL CARE IF:   Your need to urinate often gets worse.  You feel increased pain or irritation when you urinate.  You notice blood in your urine.  You have questions about any medications that your health care provider recommended.  You notice blood, pus, or swelling at the site of any test or treatment procedure.  You develop a fever of more than 100.49F (38.1C). SEEK IMMEDIATE MEDICAL CARE IF:  You develop a fever of more than 102.53F (38.9C).   This information is not intended to replace advice given to you by your health care provider. Make sure you discuss any questions you have with your health care provider.   Document Released: 03/06/2009 Document Revised:  05/31/2014 Document Reviewed: 03/06/2009 Elsevier Interactive Patient Education 2016 Elsevier Inc. Knee Pain Knee pain is a very common symptom and can have many causes. Knee pain often goes away when you follow your health care provider's instructions for relieving pain and discomfort at home. However, knee pain can develop into a condition that needs treatment. Some conditions may include:  Arthritis caused by wear and tear (osteoarthritis).  Arthritis caused by swelling and irritation (rheumatoid arthritis or gout).  A cyst or growth in your knee.  An infection in your knee joint.  An injury that will not heal.  Damage, swelling, or irritation of the tissues that support your knee (torn ligaments or tendinitis). If your knee pain continues, additional tests may be ordered to diagnose your condition. Tests may include X-rays or other imaging studies of your knee. You may also need to have fluid removed from your knee.  Treatment for ongoing knee pain depends on the cause, but treatment may include:  Medicines to relieve pain or swelling.  Steroid injections in your knee.  Physical therapy.  Surgery. HOME CARE INSTRUCTIONS  Take medicines only as directed by your health care provider.  Rest your knee and keep it raised (elevated) while you are resting.  Do not do things that cause or worsen pain.  Avoid high-impact activities or exercises, such as running, jumping rope, or doing jumping jacks.  Apply ice to the knee area:  Put ice in a plastic bag.  Place a towel between your skin and the bag.  Leave the ice on for 20 minutes, 2-3 times a day.  Ask your health care provider if you should wear an elastic knee support.  Keep a pillow under your knee when you sleep.  Lose weight if you are overweight. Extra weight can put pressure on your knee.  Do not use any tobacco products, including cigarettes, chewing tobacco, or electronic cigarettes. If you need help quitting, ask your health care provider. Smoking may slow the healing of any bone and joint problems that you may have. SEEK MEDICAL CARE IF:  Your knee pain continues, changes, or gets worse.  You have a fever along with knee pain.  Your knee buckles or locks up.  Your knee becomes more swollen. SEEK IMMEDIATE MEDICAL CARE IF:   Your knee joint feels hot to the touch.  You have chest pain or trouble breathing.   This information is not intended to replace advice given to you by your health care provider. Make sure you discuss any questions you have with your health care provider.   Document Released: 03/07/2007 Document Revised: 05/31/2014 Document Reviewed: 12/24/2013 Elsevier Interactive Patient Education 2016 [employer].  Exercising to [employer] Exercising can help you to lose weight. In order to lose weight through exercise, you need to do vigorous-intensity exercise. You can tell that you are exercising with  vigorous intensity if you are breathing very hard and fast and cannot hold a conversation while exercising. Moderate-intensity exercise helps to maintain your current weight. You can tell that you are exercising at a moderate level if you have a higher heart rate and faster breathing, but you are still able to hold a conversation. HOW OFTEN SHOULD I EXERCISE? Choose an activity that you enjoy and set realistic goals. Your health care provider can help you to make an activity plan that works for you. Exercise regularly as directed by your health care provider. This may include:  Doing resistance training twice each week, such as:  Push-ups.  Sit-ups.  Lifting weights.  Using resistance bands.  Doing a given intensity of exercise for a given amount of time. Choose from these options:  150 minutes of moderate-intensity exercise every week.  75 minutes of vigorous-intensity exercise every week.  A mix of moderate-intensity and vigorous-intensity exercise every week. Children, pregnant women, people who are out of shape, people who are overweight, and older adults may need to consult a health care provider for individual recommendations. If you have any sort of medical condition, be sure to consult your health care provider before starting a new exercise program. WHAT ARE SOME ACTIVITIES THAT CAN HELP ME TO LOSE WEIGHT?   Walking at a rate of at least 4.5 miles an hour.  Jogging or running at a rate of 5 miles per hour.  Biking at a rate of at least 10 miles per hour.  Lap swimming.  Roller-skating or in-line skating.  Cross-country skiing.  Vigorous competitive sports, such as football, basketball, and soccer.  Jumping rope.  Aerobic dancing. HOW CAN I BE MORE ACTIVE IN MY DAY-TO-DAY ACTIVITIES?  Use the stairs instead of the elevator.  Take a walk during your lunch break.  If you drive, park your car farther away from work or school.  If you take public transportation,  get off one stop early and walk the rest of the way.  Make all of your phone calls while standing up and walking around.  Get up, stretch, and walk around every 30 minutes throughout the day. WHAT GUIDELINES SHOULD I FOLLOW WHILE EXERCISING?  Do not exercise so much that you hurt yourself, feel dizzy, or get very short of breath.  Consult your health care provider prior to starting a new exercise program.  Wear comfortable clothes and shoes with good support.  Drink plenty of water while you exercise to prevent dehydration or heat stroke. Body water is lost during exercise and must be replaced.  Work out until you breathe faster and your heart beats faster.   This information is not intended to replace advice given to you by your health care provider. Make sure you discuss any questions you have with your health care provider.   Document Released: 06/12/2010 Document Revised: 05/31/2014 Document Reviewed: 10/11/2013 Elsevier Interactive Patient Education 2016 [employer].  Emergency Department Resource Guide 1) Find a Doctor and Pay Out of Pocket Although you won't have to find out who is covered by your insurance plan, it is a good idea to ask around and get recommendations. You will then need to call the office and see if the doctor you have chosen will accept you as a new patient and what types of options they offer for patients who are self-pay. Some doctors offer discounts or will set up payment plans for their patients who do not have insurance, but you will need to ask so you aren't surprised when you get to your appointment.  2) Contact Your Local Health Department Not all health departments have doctors that can see patients for sick visits, but many do, so it is worth a call to see if yours does. If you don't know where your local health department is, you can check in your phone book. The CDC also has a tool to help you locate your state's health department, and many state  websites also have listings of all of their local health departments.  3) Find a Walk-in Clinic If your illness is not likely to be very severe or complicated, you  may want to try a walk in clinic. These are popping up all over the country in pharmacies, drugstores, and shopping centers. They're usually staffed by nurse practitioners or physician assistants that have been trained to treat common illnesses and complaints. They're usually fairly quick and inexpensive. However, if you have serious medical issues or chronic medical problems, these are probably not your best option.  No Primary Care Doctor: - Call Health Connect at  [phone] - they can help you locate a primary care doctor that  accepts your insurance, provides certain services, etc. - Physician Referral Service- [phone]  Chronic Pain Problems: Organization         Address  Phone   Notes  [facility]  [phone] Patients need to be referred by their primary care doctor.   Medication Assistance: Organization         Address  Phone   Notes  [facility] Medication [facility] [address] [place], [place] [address] [phone] --Must be a resident of [facility] -- Must have NO insurance coverage whatsoever (no Medicaid/ Medicare, etc.) -- The pt. MUST have a primary care doctor that directs their care regularly and follows them in the community   [facility]  [phone]   [employer]  [phone]    Agencies that provide inexpensive medical care: Organization         Address  Phone   Notes  [facility]  [phone]   [facility]    [phone]   [facility] [address] [place], [place] [address] [phone]   [facility] [address] [place] [place] [phone]   Planned Parenthood    [phone]   [facility]    [phone]   [facility]  [address], [place] Phone:  [phone], Fax:  [phone] Hours of Operation:  9 am - 6 pm, M-F.  Also accepts Medicaid/Medicare and self-pay.  [facility]  [address], [place] Phone: [phone], Fax: [phone]. Hours of Operation:  8:30 am - 5:30 pm, M-F.  Also accepts Medicaid and self-pay.  [facility] [place] [address], [place] Phone: [phone]   [facility] [address] Natasha Bence [place], [place] [phone], Ext. [phone] Mondays & Thursdays: 7-9 AM.  First 15 patients are seen on a first come, first serve basis.    Medicaid-accepting [facility] Providers:  Organization         Address  Phone   Notes  [facility] [address], [place] [phone] Also accepts self-pay patients.  [facility] [address] Laurell Josephs [place], [place]  [phone]   [facility] [address], [place] [phone]   [facility] [address] [place] [phone]   Renaye Rakers [address], [place]   [phone] Only accepts [place] Access [place] patients after they have their name applied to their card.   Self-Pay (no insurance) in [facility]:  Organization         Address  Phone   Notes  Sickle Cell Patients, [facility] [address] [place], [place] [phone]   [facility] [address] [place], [place] [phone])  [phone]   [facility] [place]  [address], [place] [phone]   [facility]/Dr. Osei-Bonsu  [address] [place] [address], [place] or [address], [place] [phone]) [phone] Phone number for both [place] and [place] locations is the same.  [facility] [address], [place] [phone]) [phone]   [facility] [place] [address] [place]  [place] [address], [place] or [address] [phone]) [phone]   [facility] [address], [place] [phone], phone; [phone]) [phone], fax Sees patients 1st and 3rd Saturday of every month.  Must not qualify for public or private insurance (i.e. Medicaid, Medicare, [place] Health Choice, Veterans' Benefits)  Household income should be no more than 200% of the poverty level The clinic cannot treat you if you are pregnant or think you are pregnant  Sexually transmitted diseases are not treated at the clinic.    Dental Care: Organization         Address  Phone  Notes  [facility] [address], [place] [phone] Accepts children up to age 25 who are enrolled in [place] or [place] Health Choice; pregnant women with a Medicaid card; and children who have applied for Medicaid or [place] Health Choice, but were declined, whose parents can pay a reduced fee at time of service.  [facility] [place]  [address], [place] [phone] Accepts children up to age 25 who are enrolled in [place] or [place] Health Choice; pregnant women with a Medicaid card; and children who have applied for Medicaid or [place] Health Choice, but were declined, whose parents can pay a reduced fee at time of service.  [facility]  [address], [place] [phone]) [phone] Patients are seen by appointment only. Walk-ins are not accepted. [facility] will see patients 25 years of age and older. Monday - Tuesday (8am-5pm) Most Wednesdays (8:30-5pm) $30 per visit, cash only  [facility]  [address], [facility] [place] [phone] Patients are seen by appointment only. Walk-ins are not accepted. [facility] will see patients 25 years of age and older. One Wednesday Evening (Monthly: Volunteer Based).  $30 per visit, cash only  [facility]  [phone] for adults; Children under age 944, call Graduate Pediatric Dentistry at [phone]. Children aged 904-14, please call [phone] to request a pediatric application.  Dental services are provided in all areas of dental care including fillings, crowns and bridges, complete and partial dentures, implants, gum treatment, root canals, and extractions. Preventive care is also provided. Treatment is provided to both adults and children. Patients are selected via a lottery and there is often a waiting list.   [facility] [address], [place]  [phone]) [phone] [email or web address]   [facility] [address], [place], [place] [phone], Ext. [phone] Second and Fourth Thursday of each month, opens at 6:30 AM; Clinic ends at 9 AM.  Patients are seen on a first-come first-served basis, and a limited number are seen during each clinic.   [facility]  [address], [place], [place] [phone]   Eligibility Requirements You must have lived in [place], [place], or [place] counties for at least the last three months.   You cannot be eligible for state  or Teacher, music, including [employer], [place], or [employer].   You generally cannot be eligible for healthcare insurance through your employer.    How to apply: Eligibility screenings are held every Tuesday and Wednesday afternoon from 1:00 pm until 4:00 pm. You do not need an appointment for the interview!  [facility] [address] [place], [place] [phone]   [facility] Health Department  [phone]   [facility] Health Department  [phone]   [facility]  [phone]    Behavioral Health Resources in the Community: Intensive Outpatient Programs Organization         Address  Phone  Notes  [facility] [address] [place], [place]  [phone]   [facility] Outpatient [address], [place], [place] [phone]   [facility] [address] [place], [place]  [phone]   [facility] Mental Health [address],  [place], [place] [phone] or [phone]   Substance Abuse Resources Organization         Address  Phone  Notes  [facility]  [phone]   [facility]  [phone]   [facility] [place]  [phone]   Floydene Flock  [phone]   Residential & Outpatient Substance Abuse Program  [phone]   Psychological Services Organization         Address  Phone  Notes  [facility]al Health  [phone]   [facility]  [phone]   [facility] [address], [place] [phone] or [phone]    Mobile Crisis Teams Organization         Address  Phone  Notes  [facility], Mobile Crisis Care Unit  [phone]   [facility]  [address] [place], [place] [phone]   Doristine Locks [address] [place] [phone]    Self-Help/Support Groups Organization         Address  Phone             Notes  [facility] [place] - variety of support groups  [phone]- [phone] Call for more information  Narcotics Anonymous (NA), [facility] [address], [employer] [place]  2 meetings at this location   Statistician         Address  Phone  Notes  [facility] [address],    [place]  [phone]   [facility]  [address], [place] [address], [place], [place] [phone]   [facility] [address] [place], [place] [phone] Admissions: 8am-3pm M-F  [facility] [address]    [place], [place] [phone]   [facility] [address] [place], [place] [phone]   [facility] [address]    [place], [place] [phone]   [facility] [address], [place], [place] [phone]   [facility] [address] [place]  [place], [place] [phone] or [phone]   [facility] [address] [place], [place] [phone] Accepts Medicaid  [facility] [address]  [place] [phone] Substance Abuse/Addiction Treatment   [facility]         Address  Phone  Notes  [facility]  [phone]   Angie Fava, PhD [address] [place], [place]   [phone] or [phone]) [phone]   [facility]   [address] [place], [place] [phone]   [facility] [address], [place], [place] [phone] Insurance/Medicaid/sponsorship through [facility] [address]                                    [place], [place] [phone] Therapy/tele-psych/case  [facility] [address]   [place], [place] [phone]    Dr. Lolly Mustache  [phone]   [facility]  [employer] [facility] Dept. 1) [address] [place] 2) [address], [place] 3)  [address], [place] [phone]  [phone]   [facility] Child Abuse Hotline [phone] or [phone] (After Hours)

## 2015-05-20 NOTE — ED Notes (Signed)
Pt verbalizes understanding of d/c instructions and denies any further needs at this time. 

## 2015-05-20 NOTE — ED Provider Notes (Signed)
CSN: [id number]     Arrival date & time 05/20/15  1853 History  By signing my name below, I, Doreatha Martin, attest that this documentation has been prepared under the direction and in the presence of Arby Barrette, MD. Electronically Signed: Doreatha Martin, ED Scribe. 05/20/2015. 11:01 PM.    Chief Complaint  Patient presents with  . Urinary Frequency   The history is provided by the patient. No language interpreter was used.    HPI Comments: Leslie Reyes is a 24 y.o. female with h/o prediabetes who presents to the Emergency Department complaining of moderate, sharp, constant bilateral knee pain onset 2 weeks ago. She also complains of urinary frequency (4-5 times/hour) onset a month ago and worsened 2 days ago.  She states she does not drink caffienated or sugary beverages. Pt denies taking OTC medications at home to improve symptoms. She reports that knee pain is worsened with weight bearing and movement and alleviated with rest. She reports that she has been drinking 6-10 bottles of water per day to stay hydrated d/t being outside frequently for her job. 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 Pt is not currently followed by a PCP. She denies dysuria, hematuria.    History reviewed. No pertinent past medical history. Past Surgical History  Procedure Laterality Date  . Wisdom teeth removal     History reviewed. No pertinent family history. Social History  Substance Use Topics  . Smoking status: Never Smoker   . Smokeless tobacco: None  . Alcohol Use: No   OB History    No data available     Review of Systems  10 Systems reviewed and are negative for acute change except as noted in the HPI.   Allergies  Review of patient's allergies indicates no known allergies.  Home Medications   Prior to Admission medications   Medication Sig Start Date End Date Taking? Authorizing Provider  chlorpheniramine-HYDROcodone (TUSSIONEX PENNKINETIC ER)  10-8 MG/5ML LQCR Take 5 mLs by mouth every 12 (twelve) hours as needed. Patient not taking: Reported on 01/17/2015 03/28/14   Paula Libra, MD  erythromycin ophthalmic ointment Place a 1/2 inch ribbon of ointment into the eyelid. Patient not taking: Reported on 01/17/2015 06/12/14   Linwood Dibbles, MD  HYDROcodone-acetaminophen (NORCO/VICODIN) 5-325 MG per tablet Take 1-2 tablets every 6 hours as needed for severe pain Patient not taking: Reported on 01/17/2015 04/28/14   Renne Crigler, PA-C  ibuprofen (ADVIL,MOTRIN) 800 MG tablet Take 1 tablet (800 mg total) by mouth 3 (three) times daily. Patient not taking: Reported on 01/17/2015 12/19/13   Ladona Mow, PA-C  ibuprofen (ADVIL,MOTRIN) 800 MG tablet Take 1 tablet (800 mg total) by mouth 3 (three) times daily. Patient not taking: Reported on 01/17/2015 01/19/14   Elson Areas, PA-C  naproxen (NAPROSYN) 500 MG tablet Take 1 tablet (500 mg total) by mouth 2 (two) times daily. Patient not taking: Reported on 01/17/2015 04/28/14   Renne Crigler, PA-C  naproxen (NAPROSYN) 500 MG tablet Take 1 tablet (500 mg total) by mouth 2 (two) times daily. 05/20/15   Arby Barrette, MD  naproxen sodium (ANAPROX) 220 MG tablet Take 220 mg by mouth 2 (two) times daily with a meal.    Historical Provider, MD  oxyCODONE-acetaminophen (PERCOCET/ROXICET) 5-325 MG per tablet Take 1-2 tablets by mouth every 6 (six) hours as needed for severe pain (for pain). Patient not taking: Reported on 01/17/2015 06/27/14   Paula Libra, MD  predniSONE (  STERAPRED UNI-PAK) 10 MG tablet Take by mouth daily.    Historical Provider, MD  tobramycin (TOBREX) 0.3 % ophthalmic ointment Place 1 application into both eyes 3 (three) times daily. Apply to both upper eyelids at lid margins Patient not taking: Reported on 01/17/2015 06/12/14   Rolan BuccoMelanie Belfi, MD   BP 128/71 mmHg  Pulse 64  Temp(Src) 98.3 F (36.8 C)  Resp 20  Ht 5\' 8"  (1.727 m)  Wt 385 lb (174.635 kg)  BMI 58.55 kg/m2  SpO2 100%  LMP  05/06/2015 Physical Exam  Constitutional: She is oriented to person, place, and time.  Patient is alert and nontoxic. She is well in appearance. Patient is morbidly obese.  HENT:  Head: Normocephalic and atraumatic.  Eyes: Conjunctivae and EOM are normal.  Neck: Normal range of motion. Neck supple.  Cardiovascular: Normal rate, regular rhythm, normal heart sounds and intact distal pulses.   Pulmonary/Chest: Effort normal. No respiratory distress. She has no wheezes. She has no rales.  Abdominal: Soft. Bowel sounds are normal. She exhibits no distension. There is no tenderness.  Musculoskeletal: Normal range of motion. She exhibits no edema or tenderness.  No effusion of the knee. No calf tenderness. Range of motion intact.  Neurological: She is alert and oriented to person, place, and time.  Skin: Skin is warm and dry.  Psychiatric: She has a normal mood and affect. Her behavior is normal.  Nursing note and vitals reviewed.   ED Course  Procedures (including critical care time) DIAGNOSTIC STUDIES: Oxygen Saturation is 99% on RA, normal by my interpretation.    COORDINATION OF CARE: 11:00 PM Discussed treatment plan with pt at bedside which includes urinalysis and CBG and pt agreed to plan.   Labs Review Labs Reviewed  PREGNANCY, URINE  URINALYSIS, ROUTINE W REFLEX MICROSCOPIC (NOT AT [facility])  CBG MONITORING, ED   I have personally reviewed and evaluated these lab results as part of my medical decision-making.  MDM   Final diagnoses:  Urinary frequency  Knee pain, acute, left  Knee pain, acute, right  Morbid obesity, unspecified obesity type [facility](HCC)   Patient has chief complaint of urinary frequency. UA is negative. Patient does not have hyperglycemia. She does not have abdominal pain or fever. Pregnancy is negative. She does report drinking 8-10 bottles of water per day. At this time I do believe this is from fluid consumption. Patient also complains of bilateral knee pain.  Patient does have morbid obesity. There is no indication at this time of effusion or acute finding. I feel this is most likely due to patient's body habitus and overuse type symptoms. He is instructed on icing and elevating and naproxen for pain. She is also counseled on seeking additional treatment for physical conditioning and weight loss.  Arby BarretteMarcy Cailie Bosshart, MD 05/20/15 [phone]

## 2015-06-10 ENCOUNTER — Emergency Department (HOSPITAL_BASED_OUTPATIENT_CLINIC_OR_DEPARTMENT_OTHER): Payer: Commercial Managed Care - HMO

## 2015-06-10 ENCOUNTER — Encounter (HOSPITAL_BASED_OUTPATIENT_CLINIC_OR_DEPARTMENT_OTHER): Payer: Self-pay | Admitting: *Deleted

## 2015-06-10 ENCOUNTER — Emergency Department (HOSPITAL_BASED_OUTPATIENT_CLINIC_OR_DEPARTMENT_OTHER)
Admission: EM | Admit: 2015-06-10 | Discharge: 2015-06-10 | Disposition: A | Discharge status: Home / Self Care | Payer: Commercial Managed Care - HMO | Attending: Emergency Medicine | Admitting: Emergency Medicine

## 2015-06-10 DIAGNOSIS — Z791 Long term (current) use of non-steroidal anti-inflammatories (NSAID): Secondary | ICD-10-CM | POA: Insufficient documentation

## 2015-06-10 DIAGNOSIS — J159 Unspecified bacterial pneumonia: Secondary | ICD-10-CM | POA: Diagnosis not present

## 2015-06-10 DIAGNOSIS — J189 Pneumonia, unspecified organism: Secondary | ICD-10-CM

## 2015-06-10 DIAGNOSIS — R05 Cough: Secondary | ICD-10-CM | POA: Diagnosis present

## 2015-06-10 MED ORDER — ALBUTEROL SULFATE (2.5 MG/3ML) 0.083% IN NEBU
5.0000 mg | INHALATION_SOLUTION | Freq: Once | RESPIRATORY_TRACT | Status: AC
  Administered 2015-06-10: 5 mg via RESPIRATORY_TRACT
  Filled 2015-06-10: qty 6

## 2015-06-10 MED ORDER — ALBUTEROL SULFATE HFA 108 (90 BASE) MCG/ACT IN AERS
1.0000 | INHALATION_SPRAY | RESPIRATORY_TRACT | Status: DC | PRN
  Administered 2015-06-10: 2 via RESPIRATORY_TRACT
  Filled 2015-06-10: qty 6.7

## 2015-06-10 MED ORDER — PREDNISONE 50 MG PO TABS: 50.0000 mg | ORAL_TABLET | Freq: Every day | ORAL | Status: DC

## 2015-06-10 MED ORDER — LEVOFLOXACIN 500 MG PO TABS: 500.0000 mg | ORAL_TABLET | Freq: Every day | ORAL | Status: DC

## 2015-06-10 MED ORDER — LEVOFLOXACIN 500 MG PO TABS
500.0000 mg | ORAL_TABLET | Freq: Once | ORAL | Status: AC
  Administered 2015-06-10: 500 mg via ORAL
  Filled 2015-06-10: qty 1

## 2015-06-10 MED ORDER — BENZONATATE 100 MG PO CAPS: 100.0000 mg | ORAL_CAPSULE | Freq: Three times a day (TID) | ORAL | Status: DC | PRN

## 2015-06-10 MED ORDER — PREDNISONE 50 MG PO TABS
60.0000 mg | ORAL_TABLET | Freq: Once | ORAL | Status: AC
  Administered 2015-06-10: 60 mg via ORAL
  Filled 2015-06-10: qty 1

## 2015-06-10 MED FILL — BENZONATATE 100 MG CAPSULE: 100 | 7 days supply | Qty: 21 | Fill #0

## 2015-06-10 MED FILL — predniSONE 50 MG TABS: 50 | 4 days supply | Qty: 4 | Fill #0

## 2015-06-10 MED FILL — levoFLOXacin 500 MG TABS: 500 | 6 days supply | Qty: 6 | Fill #0

## 2015-06-10 NOTE — ED Notes (Signed)
Patient asked to change into a gown.  

## 2015-06-10 NOTE — ED Notes (Signed)
Cough x 2 weeks. Sob x 3 days. Chest tightness yesterday. No relief with her inhaler. She is not sure if her inhaler is working.

## 2015-06-10 NOTE — ED Provider Notes (Signed)
CSN: [id number]     Arrival date & time 06/10/15  1338 History   First MD Initiated Contact with Patient 06/10/15 1404     Chief Complaint  Patient presents with  . Cough  . Shortness of Breath     (Consider location/radiation/quality/duration/timing/severity/associated sxs/prior Treatment) HPI Patient with URI symptoms and cough for the last 2 weeks. Developed shortness of breath with chest tightness over the last 3 days. Chest tightness has been constant since yesterday. Continues to have cough but is nonproductive. No fever or chills. No new lower extremity swelling or pain. Denies abdominal pain.  No nausea or vomiting. History reviewed. No pertinent past medical history. Past Surgical History  Procedure Laterality Date  . Wisdom teeth removal     No family history on file. Social History  Substance Use Topics  . Smoking status: Never Smoker   . Smokeless tobacco: None  . Alcohol Use: No   OB History    No data available     Review of Systems  Constitutional: Negative for fever and chills.  HENT: Negative for congestion, sinus pressure and sore throat.   Respiratory: Positive for cough, chest tightness and shortness of breath. Negative for wheezing.   Cardiovascular: Negative for chest pain, palpitations and leg swelling.  Gastrointestinal: Negative for nausea, vomiting, abdominal pain and diarrhea.  Musculoskeletal: Negative for back pain, neck pain and neck stiffness.  Skin: Negative for rash and wound.  Neurological: Negative for dizziness, weakness, light-headedness, numbness and headaches.  All other systems reviewed and are negative.     Allergies  Review of patient's allergies indicates no known allergies.  Home Medications   Prior to Admission medications   Medication Sig Start Date End Date Taking? Authorizing Provider  benzonatate (TESSALON) 100 MG capsule Take 1 capsule (100 mg total) by mouth 3 (three) times daily as needed for cough. 06/10/15   Loren Racer, MD  chlorpheniramine-HYDROcodone [facility] PENNKINETIC ER) 10-8 MG/5ML LQCR Take 5 mLs by mouth every 12 (twelve) hours as needed. Patient not taking: Reported on 01/17/2015 03/28/14   Paula Libra, MD  erythromycin ophthalmic ointment Place a 1/2 inch ribbon of ointment into the eyelid. Patient not taking: Reported on 01/17/2015 06/12/14   Linwood Dibbles, MD  HYDROcodone-acetaminophen (NORCO/VICODIN) 5-325 MG per tablet Take 1-2 tablets every 6 hours as needed for severe pain Patient not taking: Reported on 01/17/2015 04/28/14   Renne Crigler, PA-C  ibuprofen (ADVIL,MOTRIN) 800 MG tablet Take 1 tablet (800 mg total) by mouth 3 (three) times daily. Patient not taking: Reported on 01/17/2015 12/19/13   Ladona Mow, PA-C  ibuprofen (ADVIL,MOTRIN) 800 MG tablet Take 1 tablet (800 mg total) by mouth 3 (three) times daily. Patient not taking: Reported on 01/17/2015 01/19/14   Elson Areas, PA-C  levofloxacin (LEVAQUIN) 500 MG tablet Take 1 tablet (500 mg total) by mouth daily. 06/10/15   Loren Racer, MD  naproxen (NAPROSYN) 500 MG tablet Take 1 tablet (500 mg total) by mouth 2 (two) times daily. Patient not taking: Reported on 01/17/2015 04/28/14   Renne Crigler, PA-C  naproxen (NAPROSYN) 500 MG tablet Take 1 tablet (500 mg total) by mouth 2 (two) times daily. 05/20/15   Arby Barrette, MD  naproxen sodium (ANAPROX) 220 MG tablet Take 220 mg by mouth 2 (two) times daily with a meal.    Historical Provider, MD  oxyCODONE-acetaminophen (PERCOCET/ROXICET) 5-325 MG per tablet Take 1-2 tablets by mouth every 6 (six) hours as needed for severe pain (for pain). Patient not  taking: Reported on 01/17/2015 06/27/14   Paula Libra, MD  predniSONE (DELTASONE) 50 MG tablet Take 1 tablet (50 mg total) by mouth daily. 06/10/15   Loren Racer, MD  tobramycin (TOBREX) 0.3 % ophthalmic ointment Place 1 application into both eyes 3 (three) times daily. Apply to both upper eyelids at lid margins Patient not taking: Reported  on 01/17/2015 06/12/14   Rolan Bucco, MD   BP 115/76 mmHg  Pulse 82  Temp(Src) 98.4 F (36.9 C) (Oral)  Resp 20  Ht  (1.727 m)  Wt 385 lb (174.635 kg)  BMI 58.55 kg/m2  SpO2 100%  LMP 05/17/2015 Physical Exam  Constitutional: She is oriented to person, place, and time. She appears well-developed and well-nourished. No distress.  HENT:  Head: Normocephalic and atraumatic.  Mouth/Throat: Oropharynx is clear and moist. No oropharyngeal exudate.  Eyes: EOM are normal. Pupils are equal, round, and reactive to light.  Neck: Normal range of motion. Neck supple. No JVD present.  Cardiovascular: Normal rate and regular rhythm.  Exam reveals no gallop and no friction rub.   No murmur heard. Pulmonary/Chest: Effort normal and breath sounds normal. No respiratory distress. She has no wheezes. She has no rales. She exhibits no tenderness.  Prolonged expiratory phase throughout.  Abdominal: Soft. Bowel sounds are normal. She exhibits no distension and no mass. There is no tenderness. There is no rebound and no guarding.  Musculoskeletal: Normal range of motion. She exhibits no edema or tenderness.   No calf swelling or tenderness  Neurological: She is alert and oriented to person, place, and time.  Moves all extremities without deficit. Sensation is fully intact.  Skin: Skin is warm and dry. No rash noted. No erythema.  Psychiatric: She has a normal mood and affect. Her behavior is normal.  Nursing note and vitals reviewed.   ED Course  Procedures (including critical care time) Labs Review Labs Reviewed - No data to display  Imaging Review Dg Chest 2 View  06/10/2015  CLINICAL DATA:  Cough for 2 weeks, shortness of breath for 4 days EXAM: CHEST  2 VIEW COMPARISON:  None. FINDINGS: Cardiomediastinal silhouette is unremarkable. No pulmonary edema. Streaky left base retrocardiac atelectasis or early infiltrate. IMPRESSION: No pulmonary edema. Streaky left base retrocardiac atelectasis or  early infiltrate best seen on lateral view. Electronically Signed   By: Natasha Mead M.D.   On: 06/10/2015 14:58   I have personally reviewed and evaluated these images and lab results as part of my medical decision-making.   EKG Interpretation None     ED ECG REPORT   Date: 06/10/2015  Rate: 69  Rhythm: sinus arrhythmia  QRS Axis: normal  Intervals: normal  ST/T Wave abnormalities: nonspecific T wave changes  Conduction Disutrbances:none  Narrative Interpretation:   Old EKG Reviewed: none available  I have personally reviewed the EKG tracing and agree with the computerized printout as noted.   MDM   Final diagnoses:  CAP (community acquired pneumonia)   No significant risk factors for coronary artery disease or PE. This appears to be related to a recent URI. Likely bronchospasm. We'll treat with albuterol and reevaluate.  Streaky infiltrate on x-ray. Symptoms consistent with early pneumonia. No significant improvement with albuterol treatment the patient continues to have saturations 100%. Mild tachypnea. Given first dose of antibiotics in the emergency department. We'll discharge him with return precautions.   Loren Racer, MD 06/10/15 [phone]

## 2015-06-10 NOTE — Discharge Instructions (Signed)
Bronchospasm, Adult °A bronchospasm is a spasm or tightening of the airways going into the lungs. During a bronchospasm breathing becomes more difficult because the airways get smaller. When this happens there can be coughing, a whistling sound when breathing (wheezing), and difficulty breathing. Bronchospasm is often associated with asthma, but not all patients who experience a bronchospasm have asthma. °CAUSES  °A bronchospasm is caused by inflammation or irritation of the airways. The inflammation or irritation may be triggered by:  °· Allergies (such as to animals, pollen, food, or mold). Allergens that cause bronchospasm may cause wheezing immediately after exposure or many hours later.   °· Infection. Viral infections are believed to be the most common cause of bronchospasm.   °· Exercise.   °· Irritants (such as pollution, cigarette smoke, strong odors, aerosol sprays, and paint fumes).   °· Weather changes. Winds increase molds and pollens in the air. Rain refreshes the air by washing irritants out. Cold air may cause inflammation.   °· Stress and emotional upset.   °SIGNS AND SYMPTOMS  °· Wheezing.   °· Excessive nighttime coughing.   °· Frequent or severe coughing with a simple cold.   °· Chest tightness.   °· Shortness of breath.   °DIAGNOSIS  °Bronchospasm is usually diagnosed through a history and physical exam. Tests, such as chest X-rays, are sometimes done to look for other conditions. °TREATMENT  °· Inhaled medicines can be given to open up your airways and help you breathe. The medicines can be given using either an inhaler or a nebulizer machine. °· Corticosteroid medicines may be given for severe bronchospasm, usually when it is associated with asthma. °HOME CARE INSTRUCTIONS  °· Always have a plan prepared for seeking medical care. Know when to call your health care provider and local emergency services (911 in the [place] Know where you can access local emergency care. °· Only take medicines as  directed by your health care provider. °· If you were prescribed an inhaler or nebulizer machine, ask your health care provider to explain how to use it correctly. Always use a spacer with your inhaler if you were given one. °· It is necessary to remain calm during an attack. Try to relax and breathe more slowly.  °· Control your home environment in the following ways:   °· Change your heating and air conditioning filter at least once a month.   °· Limit your use of fireplaces and wood stoves. °· Do not smoke and do not allow smoking in your home.   °· Avoid exposure to perfumes and fragrances.   °· Get rid of pests (such as roaches and mice) and their droppings.   °· Throw away plants if you see mold on them.   °· Keep your house clean and dust free.   °· Replace carpet with wood, tile, or vinyl flooring. Carpet can trap dander and dust.   °· Use allergy-proof pillows, mattress covers, and box spring covers.   °· Wash bed sheets and blankets every week in hot water and dry them in a dryer.   °· Use blankets that are made of polyester or cotton.   °· Wash hands frequently. °SEEK MEDICAL CARE IF:  °· You have muscle aches.   °· You have chest pain.   °· The sputum changes from clear or white to yellow, green, gray, or bloody.   °· The sputum you cough up gets thicker.   °· There are problems that may be related to the medicine you are given, such as a rash, itching, swelling, or trouble breathing.   °SEEK IMMEDIATE MEDICAL CARE IF:  °· You have worsening wheezing and coughing   even after taking your prescribed medicines.   °· You have increased difficulty breathing.   °· You develop severe chest pain. °MAKE SURE YOU:  °· Understand these instructions. °· Will watch your condition. °· Will get help right away if you are not doing well or get worse. °  °This information is not intended to replace advice given to you by your health care provider. Make sure you discuss any questions you have with your health care  provider. °  °Document Released: 05/13/2003 Document Revised: 05/31/2014 Document Reviewed: 10/30/2012 °Elsevier Interactive Patient Education ©2016 [employer] °Community-Acquired Pneumonia, Adult °Pneumonia is an infection of the lungs. There are different types of pneumonia. One type can develop while a person is in a hospital. A different type, called community-acquired pneumonia, develops in people who are not, or have not recently been, in the hospital or other health care facility.  °CAUSES °Pneumonia may be caused by bacteria, viruses, or funguses. Community-acquired pneumonia is often caused by Streptococcus pneumonia bacteria. These bacteria are often passed from one person to another by breathing in droplets from the cough or sneeze of an infected person. °RISK FACTORS °The condition is more likely to develop in: °· People who have chronic diseases, such as chronic obstructive pulmonary disease (COPD), asthma, congestive heart failure, cystic fibrosis, diabetes, or kidney disease. °· People who have early-stage or late-stage HIV. °· People who have sickle cell disease. °· People who have had their spleen removed (splenectomy). °· People who have poor dental hygiene. °· People who have medical conditions that increase the risk of breathing in (aspirating) secretions their own mouth and nose.   °· People who have a weakened immune system (immunocompromised). °· People who smoke. °· People who travel to areas where pneumonia-causing germs commonly exist. °· People who are around animal habitats or animals that have pneumonia-causing germs, including birds, bats, rabbits, cats, and farm animals. °SYMPTOMS °Symptoms of this condition include: °· A dry cough. °· A wet (productive) cough. °· Fever. °· Sweating. °· Chest pain, especially when breathing deeply or coughing. °· Rapid breathing or difficulty breathing. °· Shortness of breath. °· Shaking chills. °· Fatigue. °· Muscle aches. °DIAGNOSIS °Your health  care provider will take a medical history and perform a physical exam. You may also have other tests, including: °· Imaging studies of your chest, including X-rays. °· Tests to check your blood oxygen level and other blood gases. °· Other tests on blood, mucus (sputum), fluid around your lungs (pleural fluid), and urine. °If your pneumonia is severe, other tests may be done to identify the specific cause of your illness. °TREATMENT °The type of treatment that you receive depends on many factors, such as the cause of your pneumonia, the medicines you take, and other medical conditions that you have. For most adults, treatment and recovery from pneumonia may occur at home. In some cases, treatment must happen in a hospital. Treatment may include: °· Antibiotic medicines, if the pneumonia was caused by bacteria. °· Antiviral medicines, if the pneumonia was caused by a virus. °· Medicines that are given by mouth or through an IV tube. °· Oxygen. °· Respiratory therapy. °Although rare, treating severe pneumonia may include: °· Mechanical ventilation. This is done if you are not breathing well on your own and you cannot maintain a safe blood oxygen level. °· Thoracentesis. This procedure removes fluid around one lung or both lungs to help you breathe better. °HOME CARE INSTRUCTIONS °· Take over-the-counter and prescription medicines only as told by your health care provider. °¨ Only take cough medicine if you are losing sleep.   Understand that cough medicine can prevent your body's natural ability to remove mucus from your lungs. °¨ If you were prescribed an antibiotic medicine, take it as told by your health care provider. Do not stop taking the antibiotic even if you start to feel better. °· Sleep in a semi-upright position at night. Try sleeping in a reclining chair, or place a few pillows under your head. °· Do not use tobacco products, including cigarettes, chewing tobacco, and e-cigarettes. If you need help quitting,  ask your health care provider. °· Drink enough water to keep your urine clear or pale yellow. This will help to thin out mucus secretions in your lungs. °PREVENTION °There are ways that you can decrease your risk of developing community-acquired pneumonia. Consider getting a pneumococcal vaccine if: °· You are older than 26 years of age. °· You are older than 26 years of age and are undergoing cancer treatment, have chronic lung disease, or have other medical conditions that affect your immune system. Ask your health care provider if this applies to you. °There are different types and schedules of pneumococcal vaccines. Ask your health care provider which vaccination option is best for you. °You may also prevent community-acquired pneumonia if you take these actions: °· Get an influenza vaccine every year. Ask your health care provider which type of influenza vaccine is best for you. °· Go to the dentist on a regular basis. °· Wash your hands often. Use hand sanitizer if soap and water are not available. °SEEK MEDICAL CARE IF: °· You have a fever. °· You are losing sleep because you cannot control your cough with cough medicine. °SEEK IMMEDIATE MEDICAL CARE IF: °· You have worsening shortness of breath. °· You have increased chest pain. °· Your sickness becomes worse, especially if you are an older adult or have a weakened immune system. °· You cough up blood. °  °This information is not intended to replace advice given to you by your health care provider. Make sure you discuss any questions you have with your health care provider. °  °Document Released: 05/10/2005 Document Revised: 01/29/2015 Document Reviewed: 09/04/2014 °Elsevier Interactive Patient Education ©2016 Else[employer] Inc. ° °

## 2015-07-08 ENCOUNTER — Emergency Department (HOSPITAL_BASED_OUTPATIENT_CLINIC_OR_DEPARTMENT_OTHER): Payer: Commercial Managed Care - HMO

## 2015-07-08 ENCOUNTER — Encounter (HOSPITAL_BASED_OUTPATIENT_CLINIC_OR_DEPARTMENT_OTHER): Payer: Self-pay | Admitting: *Deleted

## 2015-07-08 ENCOUNTER — Emergency Department (HOSPITAL_BASED_OUTPATIENT_CLINIC_OR_DEPARTMENT_OTHER)
Admission: EM | Admit: 2015-07-08 | Discharge: 2015-07-08 | Disposition: A | Discharge status: Home / Self Care | Payer: Commercial Managed Care - HMO | Attending: Emergency Medicine | Admitting: Emergency Medicine

## 2015-07-08 DIAGNOSIS — Z792 Long term (current) use of antibiotics: Secondary | ICD-10-CM | POA: Insufficient documentation

## 2015-07-08 DIAGNOSIS — J4531 Mild persistent asthma with (acute) exacerbation: Secondary | ICD-10-CM | POA: Insufficient documentation

## 2015-07-08 DIAGNOSIS — R05 Cough: Secondary | ICD-10-CM | POA: Diagnosis present

## 2015-07-08 DIAGNOSIS — R079 Chest pain, unspecified: Secondary | ICD-10-CM | POA: Diagnosis not present

## 2015-07-08 DIAGNOSIS — Z862 Personal history of diseases of the blood and blood-forming organs and certain disorders involving the immune mechanism: Secondary | ICD-10-CM | POA: Diagnosis not present

## 2015-07-08 DIAGNOSIS — Z79899 Other long term (current) drug therapy: Secondary | ICD-10-CM | POA: Insufficient documentation

## 2015-07-08 DIAGNOSIS — J4530 Mild persistent asthma, uncomplicated: Secondary | ICD-10-CM

## 2015-07-08 DIAGNOSIS — Z791 Long term (current) use of non-steroidal anti-inflammatories (NSAID): Secondary | ICD-10-CM | POA: Diagnosis not present

## 2015-07-08 HISTORY — DX: Anemia, unspecified: D64.9

## 2015-07-08 LAB — CBC WITH DIFFERENTIAL/PLATELET
BASOS PCT: 0 %
Basophils Absolute: 0 10*3/uL (ref 0.0–0.1)
EOS ABS: 0.2 10*3/uL (ref 0.0–0.7)
Eosinophils Relative: 4 %
HEMATOCRIT: 34.4 % — AB (ref 36.0–46.0)
HEMOGLOBIN: 10.7 g/dL — AB (ref 12.0–15.0)
LYMPHS ABS: 1.9 10*3/uL (ref 0.7–4.0)
Lymphocytes Relative: 32 %
MCH: 23.8 pg — ABNORMAL LOW (ref 26.0–34.0)
MCHC: 31.1 g/dL (ref 30.0–36.0)
MCV: 76.4 fL — ABNORMAL LOW (ref 78.0–100.0)
Monocytes Absolute: 0.4 10*3/uL (ref 0.1–1.0)
Monocytes Relative: 6 %
NEUTROS ABS: 3.6 10*3/uL (ref 1.7–7.7)
NEUTROS PCT: 58 %
Platelets: 326 10*3/uL (ref 150–400)
RBC: 4.5 MIL/uL (ref 3.87–5.11)
RDW: 15 % (ref 11.5–15.5)
WBC: 6.2 10*3/uL (ref 4.0–10.5)

## 2015-07-08 LAB — BASIC METABOLIC PANEL
ANION GAP: 7 (ref 5–15)
BUN: 14 mg/dL (ref 6–20)
CHLORIDE: 107 mmol/L (ref 101–111)
CO2: 25 mmol/L (ref 22–32)
Calcium: 8.7 mg/dL — ABNORMAL LOW (ref 8.9–10.3)
Creatinine, Ser: 0.77 mg/dL (ref 0.44–1.00)
GFR calc non Af Amer: >60 mL/min (ref >60)
Glucose, Bld: 108 mg/dL — ABNORMAL HIGH (ref 65–99)
POTASSIUM: 4.3 mmol/L (ref 3.5–5.1)
SODIUM: 139 mmol/L (ref 135–145)

## 2015-07-08 MED ORDER — ALBUTEROL SULFATE HFA 108 (90 BASE) MCG/ACT IN AERS: 1.0000 | INHALATION_SPRAY | Freq: Four times a day (QID) | RESPIRATORY_TRACT | Status: AC | PRN

## 2015-07-08 MED ORDER — DEXAMETHASONE SODIUM PHOSPHATE 10 MG/ML IJ SOLN
10.0000 mg | Freq: Once | INTRAMUSCULAR | Status: AC
  Administered 2015-07-08: 10 mg via INTRAMUSCULAR
  Filled 2015-07-08: qty 1

## 2015-07-08 MED ORDER — DOXYCYCLINE HYCLATE 100 MG PO CAPS: 100.0000 mg | ORAL_CAPSULE | Freq: Two times a day (BID) | ORAL | Status: DC

## 2015-07-08 MED ORDER — HYDROCODONE-HOMATROPINE 5-1.5 MG/5ML PO SYRP: 5.0000 mL | ORAL_SOLUTION | Freq: Four times a day (QID) | ORAL | Status: DC | PRN

## 2015-07-08 MED FILL — HYDROCODONE-HOMATROPINE SYR: 5-1.5 | 6 days supply | Qty: 120 | Fill #0

## 2015-07-08 MED FILL — VENTOLIN HFA 90 MCG INHALER: 108 (90 BAS | 30 days supply | Qty: 18 | Fill #0

## 2015-07-08 NOTE — ED Notes (Signed)
Pt directed to pharmacy to pick up medications. Note for work given

## 2015-07-08 NOTE — ED Notes (Signed)
Patient transported to X-ray 

## 2015-07-08 NOTE — Discharge Instructions (Signed)
Cough, Adult A cough helps to clear your throat and lungs. A cough may last only 2-3 weeks (acute), or it may last longer than 8 weeks (chronic). Many different things can cause a cough. A cough may be a sign of an illness or another medical condition. HOME CARE  Pay attention to any changes in your cough.  Take medicines only as told by your doctor.  If you were prescribed an antibiotic medicine, take it as told by your doctor. Do not stop taking it even if you start to feel better.  Talk with your doctor before you try using a cough medicine.  Drink enough fluid to keep your pee (urine) clear or pale yellow.  If the air is dry, use a cold steam vaporizer or humidifier in your home.  Stay away from things that make you cough at work or at home.  If your cough is worse at night, try using extra pillows to raise your head up higher while you sleep.  Do not smoke, and try not to be around smoke. If you need help quitting, ask your doctor.  Do not have caffeine.  Do not drink alcohol.  Rest as needed. GET HELP IF:  You have new problems (symptoms).  You cough up yellow fluid (pus).  Your cough does not get better after 2-3 weeks, or your cough gets worse.  Medicine does not help your cough and you are not sleeping well.  You have pain that gets worse or pain that is not helped with medicine.  You have a fever.  You are losing weight and you do not know why.  You have night sweats. GET HELP RIGHT AWAY IF:  You cough up blood.  You have trouble breathing.  Your heartbeat is very fast.   This information is not intended to replace advice given to you by your health care provider. Make sure you discuss any questions you have with your health care provider.   Document Released: 01/21/2011 Document Revised: 01/29/2015 Document Reviewed: 07/17/2014 Elsevier Interactive Patient Education 2016 [employer] Inc.  Asthma Action Plan, Adult Name:  ________________________________ Date: _______ Follow-Up Visit With Health Care Provider Bring your medicines to your follow-up visits.  Health care provider name: ____________________  Telephone: ____________________  How often should I see my health care provider? ____________________ The actions that you should take to control your asthma are based on the symptoms that you are having. The condition can be divided into 3 zones: the green zone, yellow zone, and red zone. Follow the action steps for the zone that you are in each day. GREEN ZONE: WHEN ASTHMA IS UNDER CONTROL SIGNS AND SYMPTOMS You may not have any symptoms while you are in the green zone. This means that you:  Have no coughing or wheezing, even while you are working or playing.  Sleep through the night.  Are breathing well. If you use a peak flow meter: The peak flow is above ______ (80% of your personal best or greater). You should take these medicines every day:  Controller medicine and dosage: ________________  Controller medicine and dosage: ________________  Controller medicine and dosage: ________________  Controller medicine and dosage: ________________  Before exercise, use this reliever or rescue medicine: ________________ Call your health care provider if:  You are using a reliever or rescue medicine more than 2-3 times per week. YELLOW ZONE: WHEN ASTHMA IS GETTING WORSE SIGNS AND SYMPTOMS When you are in the yellow zone, you may have symptoms that interfere with  exercise, are noticeably worse after exposure to triggers, or are worse at the first sign of a cold (upper respiratory infection). These may include:  Waking from sleep.  Coughing, especially at night or first thing in the morning.  Mild wheezing.  Chest tightness. If you use a peak flow meter: The peak flow is _____ to _____ (50-79% of your personal best). Add the following medicine to the ones that you use daily:  Reliever or  rescue medicine and dosage: ________________  Additional medicine and dosage: ________________ Call your health care provider if:  You are using a reliever or rescue medicine more than 2-3 times per week.  You remain in the yellow zone for _____ hours. RED ZONE: WHEN ASTHMA IS SEVERE  SIGNS AND SYMPTOMS You will likely feel distressed and have symptoms at rest that restrict your activity. You are in the red zone if:  Your breathing is hard and quickly.  Your nose opens wide, your ribs show, and your neck muscles become visible when you breathe in.  Your lips, fingers, or toes are a bluish color.  You have trouble speaking in full sentences.  Your symptoms do not improve within 15-20 minutes after you use your reliever or rescue medicine (bronchodilator). If you use a peak flow meter: The peak flow is less than _____ (less than 50% of your personal best). Call your local emergency services (911 in the [place] right away or seek help at the emergency department of the nearest hospital. Use your reliever or rescue medicine.  Start a nebulizer treatment or take 2-4 puffs from a metered-dose inhaler with a spacer.  Repeat this action every 15-20 minutes until help arrives. WHAT ARE SOME COMMON ASTHMA TRIGGERS? Discuss your asthma triggers with your health care provider. Some common triggers are:  Dander from the skin, hair, or feathers of animals.  Dust mites.  Cockroaches.  Pollen from trees or grass.  Mold.  Cigarette or tobacco smoke.  Air pollutants, such as dust, household cleaners, hair sprays, aerosol sprays, scented candles, paint fumes, strong chemicals, or strong odors.  Cold air or changes in weather. Cold air may cause inflammation. Winds increase molds and pollens in the air.  Strong emotions, such as crying or laughing hard.  Stress.  Certain medicines, such as aspirin or beta blockers.  Sulfites in foods and drinks, such as dried fruits and  wine.  Infections or inflammatory conditions, such as:  Flu (influenza).  Upper respiratory tract infection.  Lower respiratory tract infection (pneumonia or bronchitis).  Inflammation of the nasal membranes (rhinitis).  Gastroesophageal reflux disease (GERD). GERD is a condition in which stomach acid comes up into the throat (esophagus).  Exercise or strenuous activity.   This information is not intended to replace advice given to you by your health care provider. Make sure you discuss any questions you have with your health care provider.   Document Released: 03/07/2009 Document Revised: 05/31/2014 Document Reviewed: 02/26/2014 Elsevier Interactive Patient Education [employer]

## 2015-07-08 NOTE — ED Notes (Signed)
Dx with pneumonia 1 month ago and treated with antibiotics. C/o chest pain under left breast x 2 weeks. Productive cough and laryngitis also

## 2015-07-08 NOTE — ED Provider Notes (Signed)
CSN: [id number]     Arrival date & time 07/08/15  0827 History   First MD Initiated Contact with Patient 07/08/15 0845     Chief Complaint  Patient presents with  . Cough  . Chest Pain      HPI Patient was seen in the emergency room approximately one month ago and diagnosed with community acquired pneumonia.  She was placed on antibiotics.  She says her sputum cleared and then started getting yellow again.  She continues to have cough with wheezing and productive sputum.  Patient does not smoke but her roommate does according to her she does smoke outside. Past Medical History  Diagnosis Date  . Anemia    Past Surgical History  Procedure Laterality Date  . Wisdom teeth removal     No family history on file. Social History  Substance Use Topics  . Smoking status: Never Smoker   . Smokeless tobacco: Never Used  . Alcohol Use: No   OB History    No data available     Review of Systems  Constitutional: Negative for fever.  Respiratory: Positive for cough and wheezing.       Allergies  Review of patient's allergies indicates no known allergies.  Home Medications   Prior to Admission medications   Medication Sig Start Date End Date Taking? Authorizing Provider  albuterol (PROVENTIL HFA;VENTOLIN HFA) 108 (90 Base) MCG/ACT inhaler Inhale 1-2 puffs into the lungs every 6 (six) hours as needed for wheezing or shortness of breath. 07/08/15   Nelva Nay, MD  benzonatate (TESSALON) 100 MG capsule Take 1 capsule (100 mg total) by mouth 3 (three) times daily as needed for cough. 06/10/15   Loren Racer, MD  chlorpheniramine-HYDROcodone [facility] PENNKINETIC ER) 10-8 MG/5ML LQCR Take 5 mLs by mouth every 12 (twelve) hours as needed. Patient not taking: Reported on 01/17/2015 03/28/14   Paula Libra, MD  doxycycline (VIBRAMYCIN) 100 MG capsule Take 1 capsule (100 mg total) by mouth 2 (two) times daily. 07/08/15   Nelva Nay, MD  erythromycin ophthalmic ointment Place a 1/2 inch  ribbon of ointment into the eyelid. Patient not taking: Reported on 01/17/2015 06/12/14   Linwood Dibbles, MD  HYDROcodone-homatropine [facility]) 5-1.5 MG/5ML syrup Take 5 mLs by mouth every 6 (six) hours as needed for cough. 07/08/15   Nelva Nay, MD  ibuprofen (ADVIL,MOTRIN) 800 MG tablet Take 1 tablet (800 mg total) by mouth 3 (three) times daily. Patient not taking: Reported on 01/17/2015 12/19/13   Ladona Mow, PA-C  ibuprofen (ADVIL,MOTRIN) 800 MG tablet Take 1 tablet (800 mg total) by mouth 3 (three) times daily. Patient not taking: Reported on 01/17/2015 01/19/14   Elson Areas, PA-C  levofloxacin (LEVAQUIN) 500 MG tablet Take 1 tablet (500 mg total) by mouth daily. 06/10/15   Loren Racer, MD  naproxen (NAPROSYN) 500 MG tablet Take 1 tablet (500 mg total) by mouth 2 (two) times daily. Patient not taking: Reported on 01/17/2015 04/28/14   Renne Crigler, PA-C  naproxen (NAPROSYN) 500 MG tablet Take 1 tablet (500 mg total) by mouth 2 (two) times daily. 05/20/15   Arby Barrette, MD  naproxen sodium (ANAPROX) 220 MG tablet Take 220 mg by mouth 2 (two) times daily with a meal.    Historical Provider, MD  tobramycin (TOBREX) 0.3 % ophthalmic ointment Place 1 application into both eyes 3 (three) times daily. Apply to both upper eyelids at lid margins Patient not taking: Reported on 01/17/2015 06/12/14   Rolan Bucco, MD  BP 125/71 mmHg  Pulse 73  Temp(Src) 98.2 F (36.8 C) (Oral)  Resp 12  Ht  (1.727 m)  Wt 385 lb (174.635 kg)  BMI 58.55 kg/m2  SpO2 100%  LMP 06/22/2015 (Approximate) Physical Exam  Constitutional: She is oriented to person, place, and time. She appears well-developed and well-nourished. No distress.  HENT:  Head: Normocephalic and atraumatic.  Eyes: Pupils are equal, round, and reactive to light.  Neck: Normal range of motion.  Cardiovascular: Normal rate and intact distal pulses.   Pulmonary/Chest: No accessory muscle usage. No tachypnea. No respiratory distress. She has  wheezes (Mild expiratory).  Abdominal: Normal appearance. She exhibits no distension.  Musculoskeletal: Normal range of motion.  Neurological: She is alert and oriented to person, place, and time. No cranial nerve deficit.  Skin: Skin is warm and dry. No rash noted.  Psychiatric: She has a normal mood and affect. Her behavior is normal.  Nursing note and vitals reviewed.   ED Course  Procedures (including critical care time) Labs Review Labs Reviewed  CBC WITH DIFFERENTIAL/PLATELET - Abnormal; Notable for the following:    Hemoglobin 10.7 (*)    HCT 34.4 (*)    MCV 76.4 (*)    MCH 23.8 (*)    All other components within normal limits  BASIC METABOLIC PANEL - Abnormal; Notable for the following:    Glucose, Bld 108 (*)    Calcium 8.7 (*)    All other components within normal limits    Imaging Review Dg Chest 2 View  07/08/2015  CLINICAL DATA:  Cough, left-sided chest pain for 1 month EXAM: CHEST  2 VIEW COMPARISON:  06/10/2015 FINDINGS: The heart size and mediastinal contours are within normal limits. Both lungs are clear. The visualized skeletal structures are unremarkable. IMPRESSION: No active cardiopulmonary disease. Electronically Signed   By: Elige Ko   On: 07/08/2015 09:00   I have personally reviewed and evaluated these images and lab results as part of my medical decision-making.    MDM   Final diagnoses:  Asthmatic bronchitis, mild persistent, uncomplicated         Nelva Nay, MD 07/08/15 1012

## 2015-10-10 ENCOUNTER — Emergency Department (HOSPITAL_BASED_OUTPATIENT_CLINIC_OR_DEPARTMENT_OTHER): Payer: Commercial Managed Care - HMO

## 2015-10-10 ENCOUNTER — Emergency Department (HOSPITAL_BASED_OUTPATIENT_CLINIC_OR_DEPARTMENT_OTHER)
Admission: EM | Admit: 2015-10-10 | Discharge: 2015-10-10 | Disposition: A | Discharge status: Home / Self Care | Payer: Commercial Managed Care - HMO | Attending: Emergency Medicine | Admitting: Emergency Medicine

## 2015-10-10 ENCOUNTER — Encounter (HOSPITAL_BASED_OUTPATIENT_CLINIC_OR_DEPARTMENT_OTHER): Payer: Self-pay | Admitting: *Deleted

## 2015-10-10 DIAGNOSIS — R509 Fever, unspecified: Secondary | ICD-10-CM | POA: Diagnosis present

## 2015-10-10 DIAGNOSIS — J189 Pneumonia, unspecified organism: Secondary | ICD-10-CM | POA: Diagnosis not present

## 2015-10-10 MED ORDER — AZITHROMYCIN 250 MG PO TABS: 250.0000 mg | ORAL_TABLET | Freq: Every day | ORAL | Status: DC

## 2015-10-10 MED ORDER — IPRATROPIUM-ALBUTEROL 0.5-2.5 (3) MG/3ML IN SOLN
3.0000 mL | RESPIRATORY_TRACT | Status: DC
  Administered 2015-10-10: 3 mL via RESPIRATORY_TRACT
  Filled 2015-10-10: qty 3

## 2015-10-10 MED ORDER — AZITHROMYCIN 250 MG PO TABS
1000.0000 mg | ORAL_TABLET | Freq: Once | ORAL | Status: AC
  Administered 2015-10-10: 1000 mg via ORAL
  Filled 2015-10-10: qty 4

## 2015-10-10 MED ORDER — BENZONATATE 100 MG PO CAPS
200.0000 mg | ORAL_CAPSULE | Freq: Once | ORAL | Status: AC
  Administered 2015-10-10: 200 mg via ORAL
  Filled 2015-10-10: qty 2

## 2015-10-10 MED ORDER — METOCLOPRAMIDE HCL 10 MG PO TABS
10.0000 mg | ORAL_TABLET | Freq: Once | ORAL | Status: AC
  Administered 2015-10-10: 10 mg via ORAL
  Filled 2015-10-10: qty 1

## 2015-10-10 MED ORDER — KETOROLAC TROMETHAMINE 60 MG/2ML IM SOLN
60.0000 mg | Freq: Once | INTRAMUSCULAR | Status: AC
  Administered 2015-10-10: 60 mg via INTRAMUSCULAR
  Filled 2015-10-10: qty 2

## 2015-10-10 MED ORDER — ACETAMINOPHEN 500 MG PO TABS
1000.0000 mg | ORAL_TABLET | Freq: Once | ORAL | Status: AC
Start: 2015-10-10 — End: 2015-10-10
  Administered 2015-10-10: 1000 mg via ORAL
  Filled 2015-10-10: qty 2

## 2015-10-10 NOTE — ED Notes (Signed)
[facility] No changes. Pt alert, NAD, calm, interactive, resps e/u, no dyspnea noted.

## 2015-10-10 NOTE — ED Notes (Signed)
Up to b/r, slow cautious steady gait.  

## 2015-10-10 NOTE — Discharge Instructions (Signed)
Community-Acquired Pneumonia, Adult Leslie Reyes, take antibiotics as directed and see a primary care doctor within 3 days for close follow up.  Use tylenol and ibuprofen for fever at home.  If symptoms worsen, come back to the ED immediately. Thank you. Pneumonia is an infection of the lungs. One type of pneumonia can happen while a person is in a hospital. A different type can happen when a person is not in a hospital (community-acquired pneumonia). It is easy for this kind to spread from person to person. It can spread to you if you breathe near an infected person who coughs or sneezes. Some symptoms include:  A dry cough.  A wet (productive) cough.  Fever.  Sweating.  Chest pain. HOME CARE  Take over-the-counter and prescription medicines only as told by your doctor.  Only take cough medicine if you are losing sleep.  If you were prescribed an antibiotic medicine, take it as told by your doctor. Do not stop taking the antibiotic even if you start to feel better.  Sleep with your head and neck raised (elevated). You can do this by putting a few pillows under your head, or you can sleep in a recliner.  Do not use tobacco products. These include cigarettes, chewing tobacco, and e-cigarettes. If you need help quitting, ask your doctor.  Drink enough water to keep your pee (urine) clear or pale yellow. A shot (vaccine) can help prevent pneumonia. Shots are often suggested for:  People older than 26 years of age.  People older than 26 years of age:  Who are having cancer treatment.  Who have long-term (chronic) lung disease.  Who have problems with their body's defense system (immune system). You may also prevent pneumonia if you take these actions:  Get the flu (influenza) shot every year.  Go to the dentist as often as told.  Wash your hands often. If soap and water are not available, use hand sanitizer. GET HELP IF:  You have a fever.  You lose sleep because your  cough medicine does not help. GET HELP RIGHT AWAY IF:  You are short of breath and it gets worse.  You have more chest pain.  Your sickness gets worse. This is very serious if:  You are an older adult.  Your body's defense system is weak.  You cough up blood.   This information is not intended to replace advice given to you by your health care provider. Make sure you discuss any questions you have with your health care provider.   Document Released: 10/27/2007 Document Revised: 01/29/2015 Document Reviewed: 09/04/2014 Elsevier Interactive Patient Education [employer]2016 Elsevier Inc.

## 2015-10-10 NOTE — ED Notes (Signed)
Dr. Oni into room. 

## 2015-10-10 NOTE — ED Notes (Signed)
C/o cough, body aches, HA, nausea, weakness, chills, fever. (denies: vd, dizziness or sore throat). No relief with sporadic naproxen, acetaminophen, nyquil or dayquil.

## 2015-10-10 NOTE — ED Notes (Signed)
C/o fever, body aches, HA, chills, cough, and weakness. Onset Friday. Has taken dayquil and nyquil only.

## 2015-10-10 NOTE — ED Provider Notes (Signed)
CSN: [id number]     Arrival date & time 10/10/15  0143 History   First MD Initiated Contact with Patient 10/10/15 0154     Chief Complaint  Patient presents with  . Fever     (Consider location/radiation/quality/duration/timing/severity/associated sxs/prior Treatment) HPI  Leslie Reyes is a 25 y.o. female with no significant past medical history presenting today with 3 days of fever, chills, diffuse body aches. She [place] and her symptoms have gotten worse. She has taken NyQuil and Tylenol without any relief. Fever was as high as 102 at home. She has felt incredibly weak, has a headache that is worse when she coughs. She denies any productivity to her cough. She states she did have pneumonia a couple of months ago that was treated at this emergency department.  There are no further complaints.  10 Systems reviewed and are negative for acute change except as noted in the HPI.    Past Medical History  Diagnosis Date  . Anemia    Past Surgical History  Procedure Laterality Date  . Wisdom teeth removal     History reviewed. No pertinent family history. Social History  Substance Use Topics  . Smoking status: Never Smoker   . Smokeless tobacco: Never Used  . Alcohol Use: No   OB History    No data available     Review of Systems    Allergies  Review of patient's allergies indicates no known allergies.  Home Medications   Prior to Admission medications   Medication Sig Start Date End Date Taking? Authorizing Provider  albuterol (PROVENTIL HFA;VENTOLIN HFA) 108 (90 Base) MCG/ACT inhaler Inhale 1-2 puffs into the lungs every 6 (six) hours as needed for wheezing or shortness of breath. 07/08/15   Nelva Nayobert Beaton, MD  benzonatate (TESSALON) 100 MG capsule Take 1 capsule (100 mg total) by mouth 3 (three) times daily as needed for cough. 06/10/15   Loren Raceravid Yelverton, MD  chlorpheniramine-HYDROcodone [facility](TUSSIONEX PENNKINETIC ER) 10-8  MG/5ML LQCR Take 5 mLs by mouth every 12 (twelve) hours as needed. Patient not taking: Reported on 01/17/2015 03/28/14   Paula LibraJohn Molpus, MD  doxycycline (VIBRAMYCIN) 100 MG capsule Take 1 capsule (100 mg total) by mouth 2 (two) times daily. 07/08/15   Nelva Nayobert Beaton, MD  erythromycin ophthalmic ointment Place a 1/2 inch ribbon of ointment into the eyelid. Patient not taking: Reported on 01/17/2015 06/12/14   Linwood DibblesJon Knapp, MD  HYDROcodone-homatropine [facility](HYCODAN) 5-1.5 MG/5ML syrup Take 5 mLs by mouth every 6 (six) hours as needed for cough. 07/08/15   Nelva Nayobert Beaton, MD  ibuprofen (ADVIL,MOTRIN) 800 MG tablet Take 1 tablet (800 mg total) by mouth 3 (three) times daily. Patient not taking: Reported on 01/17/2015 12/19/13   Ladona MowJoe Mintz, PA-C  ibuprofen (ADVIL,MOTRIN) 800 MG tablet Take 1 tablet (800 mg total) by mouth 3 (three) times daily. Patient not taking: Reported on 01/17/2015 01/19/14   Elson AreasLeslie K Sofia, PA-C  levofloxacin (LEVAQUIN) 500 MG tablet Take 1 tablet (500 mg total) by mouth daily. 06/10/15   Loren Raceravid Yelverton, MD  naproxen (NAPROSYN) 500 MG tablet Take 1 tablet (500 mg total) by mouth 2 (two) times daily. Patient not taking: Reported on 01/17/2015 04/28/14   Renne CriglerJoshua Geiple, PA-C  naproxen (NAPROSYN) 500 MG tablet Take 1 tablet (500 mg total) by mouth 2 (two) times daily. 05/20/15   Arby BarretteMarcy Pfeiffer, MD  naproxen sodium (ANAPROX) 220 MG tablet Take 220 mg by mouth 2 (two) times daily with  a meal.    Historical Provider, MD  tobramycin (TOBREX) 0.3 % ophthalmic ointment Place 1 application into both eyes 3 (three) times daily. Apply to both upper eyelids at lid margins Patient not taking: Reported on 01/17/2015 06/12/14   Rolan Bucco, MD   BP 132/79 mmHg  Pulse 105  Temp(Src) 102.7 F (39.3 C) (Oral)  Resp 20  Ht  (1.727 m)  Wt 370 lb (167.831 kg)  BMI 56.27 kg/m2  SpO2 97%  LMP 09/25/2015 (Approximate) Physical Exam  Constitutional: She is oriented to person, place, and time. She appears  well-developed and well-nourished. No distress.  Intermittent coughing on examination  HENT:  Head: Normocephalic and atraumatic.  Nose: Nose normal.  Mouth/Throat: Oropharynx is clear and moist. No oropharyngeal exudate.  Eyes: Conjunctivae and EOM are normal. Pupils are equal, round, and reactive to light. No scleral icterus.  Neck: Normal range of motion. Neck supple. No JVD present. No tracheal deviation present. No thyromegaly present.  Cardiovascular: Regular rhythm and normal heart sounds.  Exam reveals no gallop and no friction rub.   No murmur heard. Tachycardia  Pulmonary/Chest: Effort normal and breath sounds normal. No respiratory distress. She has no wheezes. She exhibits no tenderness.  Abdominal: Soft. Bowel sounds are normal. She exhibits no distension and no mass. There is no tenderness. There is no rebound and no guarding.  Musculoskeletal: Normal range of motion. She exhibits no edema or tenderness.  Lymphadenopathy:    She has no cervical adenopathy.  Neurological: She is alert and oriented to person, place, and time. No cranial nerve deficit. She exhibits normal muscle tone.  Skin: Skin is warm and dry. No rash noted. No erythema. No pallor.  Tactile fever  Nursing note and vitals reviewed.   ED Course  Procedures (including critical care time) Labs Review Labs Reviewed - No data to display  Imaging Review Dg Chest 2 View  10/10/2015  CLINICAL DATA:  Cough, fever, and body aches for 2 days.  Nonsmoker. EXAM: CHEST  2 VIEW COMPARISON:  07/08/2015 FINDINGS: The heart size and mediastinal contours are within normal limits. Both lungs are clear. The visualized skeletal structures are unremarkable. IMPRESSION: No active cardiopulmonary disease. Electronically Signed   By: Burman Nieves M.D.   On: 10/10/2015 02:20   I have personally reviewed and evaluated these images and lab results as part of my medical decision-making.   EKG Interpretation None      MDM    Final diagnoses:  None  Patient presents emergency department for fever cough and body aches. Will obtain chest x-ray to evaluate for pneumonia. It is possible this is a late onset of the flu as well. She is febrile and tachycardic. She was given Tylenol and Toradol. She was given Jerilynn Som for her cough. We'll continue to monitor.  3:54 AM Patient states is she starting to sweat out her fever.  Temp is now normal.  HR return to normal as well prior to duoneb treatment.  HR went back up slightly to 102.  CXR negative for pneumonia but he history is consistent with this diagnosis.  Will treat with 5 days abx and provide PCP fu.  She appears well and in NAD. Vs remain within her normal limits and she is safe for DC  Tomasita Crumble, MD 10/10/15 [phone]

## 2016-01-24 ENCOUNTER — Encounter (HOSPITAL_BASED_OUTPATIENT_CLINIC_OR_DEPARTMENT_OTHER): Payer: Self-pay | Admitting: Emergency Medicine

## 2016-01-24 ENCOUNTER — Emergency Department (HOSPITAL_BASED_OUTPATIENT_CLINIC_OR_DEPARTMENT_OTHER): Payer: Worker's Compensation

## 2016-01-24 ENCOUNTER — Emergency Department (HOSPITAL_BASED_OUTPATIENT_CLINIC_OR_DEPARTMENT_OTHER)
Admission: EM | Admit: 2016-01-24 | Discharge: 2016-01-24 | Disposition: A | Discharge status: Home / Self Care | Payer: Worker's Compensation | Attending: Emergency Medicine | Admitting: Emergency Medicine

## 2016-01-24 DIAGNOSIS — S86812A Strain of other muscle(s) and tendon(s) at lower leg level, left leg, initial encounter: Secondary | ICD-10-CM | POA: Insufficient documentation

## 2016-01-24 DIAGNOSIS — Y9339 Activity, other involving climbing, rappelling and jumping off: Secondary | ICD-10-CM | POA: Insufficient documentation

## 2016-01-24 DIAGNOSIS — Y929 Unspecified place or not applicable: Secondary | ICD-10-CM | POA: Diagnosis not present

## 2016-01-24 DIAGNOSIS — Y999 Unspecified external cause status: Secondary | ICD-10-CM | POA: Diagnosis not present

## 2016-01-24 DIAGNOSIS — Y30XXXA Falling, jumping or pushed from a high place, undetermined intent, initial encounter: Secondary | ICD-10-CM | POA: Insufficient documentation

## 2016-01-24 DIAGNOSIS — S8992XA Unspecified injury of left lower leg, initial encounter: Secondary | ICD-10-CM | POA: Diagnosis present

## 2016-01-24 DIAGNOSIS — S86912A Strain of unspecified muscle(s) and tendon(s) at lower leg level, left leg, initial encounter: Secondary | ICD-10-CM

## 2016-01-24 MED ORDER — NAPROXEN 250 MG PO TABS
500.0000 mg | ORAL_TABLET | Freq: Once | ORAL | Status: AC
  Administered 2016-01-24: 500 mg via ORAL
  Filled 2016-01-24: qty 2

## 2016-01-24 MED ORDER — NAPROXEN 500 MG PO TABS: 500.0000 mg | ORAL_TABLET | Freq: Two times a day (BID) | ORAL | 0 refills | Status: DC

## 2016-01-24 NOTE — ED Provider Notes (Signed)
[facility] Note   CSN: [id number] Arrival date & time: 01/24/16  1902 By signing my name below, I, Levon HedgerElizabeth Hall, attest that this documentation has been prepared under the direction and in the presence of Linwood DibblesJon Cortasia Screws, MD . Electronically Signed: Levon HedgerElizabeth Hall, Scribe. 01/24/2016. 10:15 PM.   History   Chief Complaint Chief Complaint  Patient presents with  . Knee Pain   HPI Leslie Reyes is a 25 y.o. female who presents to the Emergency Department complaining of sudden onset,  constant left calf pain with radiation to her thigh s/p fall tonight. Pt jumped off the loading dock and fell a few feet, landing "funny". No alleviating factors noted. Pt states she is unable to bear weight on the leg. She has no other complaints at this time.  The history is provided by the patient. No language interpreter was used.    Past Medical History:  Diagnosis Date  . Anemia     There are no active problems to display for this patient.   Past Surgical History:  Procedure Laterality Date  . wisdom teeth removal      OB History    No data available      Home Medications    Prior to Admission medications   Medication Sig Start Date End Date Taking? Authorizing Provider  albuterol (PROVENTIL HFA;VENTOLIN HFA) 108 (90 Base) MCG/ACT inhaler Inhale 1-2 puffs into the lungs every 6 (six) hours as needed for wheezing or shortness of breath. 07/08/15   Nelva Nayobert Beaton, MD  azithromycin (ZITHROMAX) 250 MG tablet Take 1 tablet (250 mg total) by mouth daily. 10/10/15   Tomasita CrumbleAdeleke Oni, MD  benzonatate (TESSALON) 100 MG capsule Take 1 capsule (100 mg total) by mouth 3 (three) times daily as needed for cough. 06/10/15   Loren Raceravid Yelverton, MD  naproxen (NAPROSYN) 500 MG tablet Take 1 tablet (500 mg total) by mouth 2 (two) times daily. 01/24/16   Linwood DibblesJon Sylvanus Telford, MD    Family History History reviewed. No pertinent family history.  Social History Social History  Substance Use Topics  .  Smoking status: Never Smoker  . Smokeless tobacco: Never Used  . Alcohol use No     Allergies   Review of patient's allergies indicates no known allergies.  Review of Systems Review of Systems  Constitutional: Negative for fever.  Musculoskeletal: Positive for arthralgias and gait problem.    Physical Exam Updated Vital Signs BP 125/90 (BP Location: Right Arm)   Pulse 66   Temp 98 F (36.7 C) (Oral)   Resp 16   Ht 5\' 8"  (1.727 m)   Wt (!) 163.3 kg   LMP 01/24/2016   SpO2 100%   BMI 54.74 kg/m   Physical Exam  Constitutional: No distress.  Morbidly obese  HENT:  Head: Normocephalic and atraumatic.  Right Ear: External ear normal.  Left Ear: External ear normal.  Eyes: Conjunctivae are normal. Right eye exhibits no discharge. Left eye exhibits no discharge. No scleral icterus.  Neck: Neck supple. No tracheal deviation present.  Cardiovascular: Normal rate.   Pulmonary/Chest: Effort normal. No stridor. No respiratory distress.  Musculoskeletal: She exhibits no edema.       Left hip: Normal.       Left knee: She exhibits no swelling, no effusion, no ecchymosis and no deformity. Tenderness found.       Left ankle: Normal.  Neurological: She is alert. Cranial nerve deficit: no gross deficits.  Skin: Skin is warm and dry. No rash noted.  Psychiatric: She has a normal mood and affect.  Nursing note and vitals reviewed.    ED Treatments / Results  DIAGNOSTIC STUDIES:  Oxygen Saturation is 100% on RA, normal by my interpretation.    COORDINATION OF CARE:  10:16 PM Discussed treatment plan with pt at bedside and pt agreed to plan.   Radiology Dg Knee Complete 4 Views Left  Result Date: 01/24/2016 CLINICAL DATA:  Jumped off dock today. Fall. Left lateral knee pain. Initial encounter. EXAM: LEFT KNEE - COMPLETE 4+ VIEW COMPARISON:  None. FINDINGS: No evidence of fracture, dislocation, or joint effusion. No evidence of arthropathy or other focal bone abnormality. Soft  tissues are unremarkable. IMPRESSION: Negative. Electronically Signed   By: Myles Rosenthal M.D.   On: 01/24/2016 20:50   Procedures Procedures (including critical care time)  Medications Ordered in ED Medications  naproxen (NAPROSYN) tablet 500 mg (not administered)     Initial Impression / Assessment and Plan / ED Course  I have reviewed the triage vital signs and the nursing notes.  Pertinent labs & imaging results that were available during my care of the patient were reviewed by me and considered in my medical decision making (see chart for details).  Clinical Course    Patient's x-rays do not show any acute abnormality. There is no dislocation or fracture. I can't appreciate any laxity but the exam is somewhat difficult based on her body habitus. We will give the patient crutches and an Ace wrap. I recommend she follow up with the sports medicine doctor or an occupational health doctor.  Final Clinical Impressions(s) / ED Diagnoses   Final diagnoses:  Knee strain, left, initial encounter    New Prescriptions New Prescriptions   NAPROXEN (NAPROSYN) 500 MG TABLET    Take 1 tablet (500 mg total) by mouth 2 (two) times daily.  I personally performed the services described in this documentation, which was scribed in my presence.  The recorded information has been reviewed and is accurate.     Linwood Dibbles, MD 01/24/16 2228

## 2016-01-24 NOTE — Discharge Instructions (Signed)
Follow-up with a sports medicine doctor or an occupational health doctor as directed by your work. Use ice to help with the swelling and the pain, take the medication as needed for pain, use the crutches and Ace wrap for support.

## 2016-01-24 NOTE — ED Notes (Signed)
EMT went in to apply ace wrap and instruct on crutches, but pt stated she was not happy with her diagnosis and would like to speak with the dr again.

## 2016-01-24 NOTE — ED Triage Notes (Signed)
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

## 2016-01-24 NOTE — ED Notes (Signed)
Pt did not want EMT to put ace wrap on leg, but stated she would take them home and use them. Pt was instructed on how to use crutches, but was crying and stating "she can't do it." EMT attempted to reassure pt and encourage pt to try again. CMS intact before and after.

## 2016-01-27 ENCOUNTER — Ambulatory Visit (INDEPENDENT_AMBULATORY_CARE_PROVIDER_SITE_OTHER): Payer: Worker's Compensation | Admitting: Family Medicine

## 2016-01-27 ENCOUNTER — Encounter: Payer: Self-pay | Admitting: Family Medicine

## 2016-01-27 DIAGNOSIS — S8992XA Unspecified injury of left lower leg, initial encounter: Secondary | ICD-10-CM

## 2016-01-27 MED ORDER — HYDROCODONE-ACETAMINOPHEN 5-325 MG PO TABS: 1.0000 | ORAL_TABLET | Freq: Four times a day (QID) | ORAL | 0 refills | Status: AC | PRN

## 2016-01-27 MED ORDER — MELOXICAM 15 MG PO TABS: 15.0000 mg | ORAL_TABLET | Freq: Every day | ORAL | 1 refills | Status: AC

## 2016-01-27 NOTE — Patient Instructions (Signed)
You have a knee sprain and contusion. Use knee brace for support - use ACE wrap if you cannot tolerate a knee brace. Crutches if needed. Icing 15 minutes at a time 3-4 times a day. Straight leg raises, knee extensions 3 sets of 10 once a day. Meloxicam 15mg  daily with food for pain and inflammation. Norco as needed for severe pain. Follow up with me in 2 weeks for reevaluation. Hopefully at that time we can get you back to light duty, possibly physical therapy.

## 2016-01-29 DIAGNOSIS — S8992XA Unspecified injury of left lower leg, initial encounter: Secondary | ICD-10-CM | POA: Insufficient documentation

## 2016-01-29 NOTE — Assessment & Plan Note (Signed)
a lot of guarding on exam limits this.  Independently reviewed her radiographs and no evidence fracture.  Ligaments stable on exam.  Consistent with knee contusion, sprain.  Shown home exercises to do daily.  Icing, meloxicam with norco as needed.  F/u in 2 weeks for reevaluation.  Out of work in meantime.

## 2016-01-29 NOTE — Progress Notes (Signed)
PCP: No PCP Per Patient  Subjective:   HPI: Patient is a 25 y.o. female here for left leg injury.  Patient reports on 9/2 she was at work - at top level of a dock, tried to step down on steps and missed a step. Felt and heard a crack in left knee. Pain level 7/10, sharp anteriorly. Knee feels unstable, like it needs to be popped. Been using ACE wrap, crutch, naproxen, muscle relaxant. No prior left knee injuries. No skin changes, numbness.  Past Medical History:  Diagnosis Date  . Anemia     Current Outpatient Prescriptions on File Prior to Visit  Medication Sig Dispense Refill  . albuterol (PROVENTIL HFA;VENTOLIN HFA) 108 (90 Base) MCG/ACT inhaler Inhale 1-2 puffs into the lungs every 6 (six) hours as needed for wheezing or shortness of breath. 1 Inhaler 0   No current facility-administered medications on file prior to visit.     Past Surgical History:  Procedure Laterality Date  . wisdom teeth removal      No Known Allergies  Social History   Social History  . Marital status: Single    Spouse name: N/A  . Number of children: N/A  . Years of education: N/A   Occupational History  . Not on file.   Social History Main Topics  . Smoking status: Never Smoker  . Smokeless tobacco: Never Used  . Alcohol use No  . Drug use: No  . Sexual activity: Not Currently    Birth control/ protection: None   Other Topics Concern  . Not on file   Social History Narrative  . No narrative on file    No family history on file.  BP 136/81   Pulse 73   Ht 5\' 8"  (1.727 m)   Wt (!) 366 lb (166 kg)   LMP 01/24/2016   BMI 55.65 kg/m   Review of Systems: See HPI above.    Objective:  Physical Exam:  Gen: NAD, comfortable in exam room  Left knee: No gross deformity, ecchymoses, effusion. TTP diffusely anterior knee especially at joint lines. ROM limited to 0-90 degrees. Negative ant/post drawers. Negative valgus/varus testing. Negative lachmanns. Pain with  mcmurrays, apleys.  Negative apprehension. NV intact distally.  Right knee: FROM without pain.    Assessment & Plan:  1. Left knee injury - a lot of guarding on exam limits this.  Independently reviewed her radiographs and no evidence fracture.  Ligaments stable on exam.  Consistent with knee contusion, sprain.  Shown home exercises to do daily.  Icing, meloxicam with norco as needed.  F/u in 2 weeks for reevaluation.  Out of work in meantime.

## 2016-02-11 ENCOUNTER — Encounter: Payer: Self-pay | Admitting: Family Medicine

## 2016-02-11 ENCOUNTER — Ambulatory Visit (INDEPENDENT_AMBULATORY_CARE_PROVIDER_SITE_OTHER): Payer: Worker's Compensation | Admitting: Family Medicine

## 2016-02-11 DIAGNOSIS — S8992XD Unspecified injury of left lower leg, subsequent encounter: Secondary | ICD-10-CM

## 2016-02-11 NOTE — Patient Instructions (Signed)
We will go ahead with an MRI to assess for a meniscus tear and/or ACL tear in your knee. Your ultrasound shows an effusion (fluid in the joint). Continue using knee brace. Icing, cane as you have been. Elevate above your heart as much as possible. Straight leg raises, knee extensions 3 sets of 10 once a day. Ibuprofen or aleve as needed for pain and inflammation. Follow up with me in 2 weeks for reevaluation. Return to light duty, desk job in meantime.

## 2016-02-12 NOTE — Assessment & Plan Note (Signed)
Patient not guarding as much today - confirmed she does have effusion.  Meniscus testing is positive.  Possible she has an ACL tear as well (common with an effusion and her mechanism) but ant drawer and lachmanns are normal.  Will go ahead with MRI to confirm meniscus tear, assess for ACL tear.  Continue brace, icing, meloxicam with norco as needed in meantime.  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

## 2016-02-12 NOTE — Progress Notes (Signed)
PCP: No PCP Per Patient  Subjective:   HPI: Patient is a 25 y.o. female here for left leg injury.  9/5: Patient reports on 9/2 she was at work - at top level of a dock, tried to step down on steps and missed a step. Felt and heard a crack in left knee. Pain level 7/10, sharp anteriorly. Knee feels unstable, like it needs to be popped. Been using ACE wrap, crutch, naproxen, muscle relaxant. No prior left knee injuries. No skin changes, numbness.  9/20: Patient reports she can walk a little better than before but still struggling a lot. Knee feels unstable. Cannot bend knee. Taking ibuprofen or naproxen, using muscle relaxant. Using knee brace with a cane. Pain worse with walking. Pain 5/10, sharp, anterior. No skin changes, numbness.  Past Medical History:  Diagnosis Date  . Anemia     Current Outpatient Prescriptions on File Prior to Visit  Medication Sig Dispense Refill  . albuterol (PROVENTIL HFA;VENTOLIN HFA) 108 (90 Base) MCG/ACT inhaler Inhale 1-2 puffs into the lungs every 6 (six) hours as needed for wheezing or shortness of breath. 1 Inhaler 0  . escitalopram (LEXAPRO) 10 MG tablet Take 10 mg by mouth daily.    Marland Kitchen. HYDROcodone-acetaminophen (NORCO) 5-325 MG tablet Take 1 tablet by mouth every 6 (six) hours as needed for moderate pain. 20 tablet 0  . meloxicam (MOBIC) 15 MG tablet Take 1 tablet (15 mg total) by mouth daily. 30 tablet 1   No current facility-administered medications on file prior to visit.     Past Surgical History:  Procedure Laterality Date  . wisdom teeth removal      No Known Allergies  Social History   Social History  . Marital status: Single    Spouse name: N/A  . Number of children: N/A  . Years of education: N/A   Occupational History  . Not on file.   Social History Main Topics  . Smoking status: Never Smoker  . Smokeless tobacco: Never Used  . Alcohol use No  . Drug use: No  . Sexual activity: Not Currently    Birth  control/ protection: None   Other Topics Concern  . Not on file   Social History Narrative  . No narrative on file    No family history on file.  BP 121/83   Pulse 96   Ht 5\' 8"  (1.727 m)   Wt (!) 360 lb (163.3 kg)   LMP 01/24/2016   BMI 54.74 kg/m   Review of Systems: See HPI above.    Objective:  Physical Exam:  Gen: NAD, comfortable in exam room  Left knee: Mod effusion.  No bruising, other deformity. TTP diffusely anterior knee especially at joint lines. ROM limited to 0-100 degrees. Negative ant/post drawers. Negative valgus/varus testing. Negative lachmanns. Pain with mcmurrays, apleys.  Negative apprehension. NV intact distally.  Right knee: FROM without pain.  MSK u/s: confirmed left knee effusion today.    Assessment & Plan:  1. Left knee injury - Patient not guarding as much today - confirmed she does have effusion.  Meniscus testing is positive.  Possible she has an ACL tear as well (common with an effusion and her mechanism) but ant drawer and lachmanns are normal.  Will go ahead with MRI to confirm meniscus tear, assess for ACL tear.  Continue brace, icing, meloxicam with norco as needed in meantime.  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

## 2016-02-16 NOTE — Addendum Note (Signed)
Addended by: Kathi SimpersWISE, Nyeshia Mysliwiec F on: 02/16/2016 08:57 AM   Modules accepted: Orders

## 2016-02-25 ENCOUNTER — Telehealth: Payer: Self-pay | Admitting: Family Medicine

## 2016-02-25 ENCOUNTER — Ambulatory Visit: Payer: Worker's Compensation | Admitting: Family Medicine

## 2016-02-27 NOTE — Telephone Encounter (Signed)
Told patient to call Gunnar Fusiaula on Monday.

## 2016-02-27 NOTE — Telephone Encounter (Signed)
Tried to contact patient. Unable to reach.

## 2016-03-04 ENCOUNTER — Ambulatory Visit (INDEPENDENT_AMBULATORY_CARE_PROVIDER_SITE_OTHER): Admitting: Family Medicine

## 2016-03-04 DIAGNOSIS — S8992XD Unspecified injury of left lower leg, subsequent encounter: Secondary | ICD-10-CM | POA: Diagnosis not present

## 2016-03-04 NOTE — Patient Instructions (Signed)
I will fill out a report for your Workers comp as requested. We are still awaiting the MRI before deciding next steps. Continue with knee brace.

## 2016-03-05 ENCOUNTER — Encounter: Payer: Self-pay | Admitting: Family Medicine

## 2016-03-08 NOTE — Telephone Encounter (Signed)
Completed.

## 2016-03-09 ENCOUNTER — Encounter: Payer: Self-pay | Admitting: Family Medicine

## 2016-03-09 NOTE — Assessment & Plan Note (Signed)
Patient with confirmed effusion by ultrasound, mechanism and exam consistent with acute meniscus tear.  We discussed it's possible she has an ACL tear as well (common with an effusion and her mechanism) but ant drawer and lachmanns are normal.  Awaiting approval for MRI to confirm meniscus tear, assess for ACL tear.  Continue brace, icing, meloxicam with norco as needed in meantime.  Continue current work restrictions.

## 2016-03-09 NOTE — Progress Notes (Signed)
PCP: No PCP Per Patient  Subjective:   HPI: Patient is a 25 y.o. female here for left leg injury.  9/5: Patient reports on 9/2 she was at work - at top level of a dock, tried to step down on steps and missed a step. Felt and heard a crack in left knee. Pain level 7/10, sharp anteriorly. Knee feels unstable, like it needs to be popped. Been using ACE wrap, crutch, naproxen, muscle relaxant. No prior left knee injuries. No skin changes, numbness.  9/20: Patient reports she can walk a little better than before but still struggling a lot. Knee feels unstable. Cannot bend knee. Taking ibuprofen or naproxen, using muscle relaxant. Using knee brace with a cane. Pain worse with walking. Pain 5/10, sharp, anterior. No skin changes, numbness.  10/12: Patient reports her pain level is still fairly severe at 6/10, sharp, anterior. Difficulty walking. Knee still feels unstable. Also knee is swollen. No skin changes, numbness. Has not had MRI yet.  Past Medical History:  Diagnosis Date  . Anemia     Current Outpatient Prescriptions on File Prior to Visit  Medication Sig Dispense Refill  . albuterol (PROVENTIL HFA;VENTOLIN HFA) 108 (90 Base) MCG/ACT inhaler Inhale 1-2 puffs into the lungs every 6 (six) hours as needed for wheezing or shortness of breath. 1 Inhaler 0  . escitalopram (LEXAPRO) 10 MG tablet Take 10 mg by mouth daily.    Marland Kitchen. HYDROcodone-acetaminophen (NORCO) 5-325 MG tablet Take 1 tablet by mouth every 6 (six) hours as needed for moderate pain. 20 tablet 0  . meloxicam (MOBIC) 15 MG tablet Take 1 tablet (15 mg total) by mouth daily. 30 tablet 1   No current facility-administered medications on file prior to visit.     Past Surgical History:  Procedure Laterality Date  . wisdom teeth removal      No Known Allergies  Social History   Social History  . Marital status: Single    Spouse name: N/A  . Number of children: N/A  . Years of education: N/A    Occupational History  . Not on file.   Social History Main Topics  . Smoking status: Never Smoker  . Smokeless tobacco: Never Used  . Alcohol use No  . Drug use: No  . Sexual activity: Not Currently    Birth control/ protection: None   Other Topics Concern  . Not on file   Social History Narrative  . No narrative on file    No family history on file.  BP 125/87   Pulse 85   Ht 5\' 8"  (1.727 m)   Wt (!) 360 lb (163.3 kg)   BMI 54.74 kg/m   Review of Systems: See HPI above.    Objective:  Physical Exam:  Gen: NAD, comfortable in exam room  Left knee: Mod effusion.  No bruising, other deformity. TTP diffusely anterior knee especially at joint lines. ROM limited to 0-110 degrees. Negative ant/post drawers. Negative valgus/varus testing. Negative lachmanns. Pain with mcmurrays, apleys. NV intact distally.  Right knee: FROM without pain.    Assessment & Plan:  1. Left knee injury - Patient with confirmed effusion by ultrasound, mechanism and exam consistent with acute meniscus tear.  We discussed it's possible she has an ACL tear as well (common with an effusion and her mechanism) but ant drawer and lachmanns are normal.  Awaiting approval for MRI to confirm meniscus tear, assess for ACL tear.  Continue brace, icing, meloxicam with norco as needed in meantime.  Continue current work restrictions.

## 2016-03-29 ENCOUNTER — Telehealth: Payer: Self-pay | Admitting: Family Medicine

## 2016-03-29 NOTE — Telephone Encounter (Signed)
Tried to contact patient, unable to. Will try again.

## 2016-03-30 NOTE — Telephone Encounter (Signed)
Spoke to patient on 03-29-16 and she gave me information about contacting for MRI. Told patient that we did not receive any forms or information from her WC.

## 2016-04-06 ENCOUNTER — Telehealth: Payer: Self-pay | Admitting: Family Medicine

## 2016-04-12 ENCOUNTER — Telehealth: Payer: Self-pay | Admitting: Family Medicine

## 2016-04-13 NOTE — Telephone Encounter (Signed)
Spoke to patient and [facility] her [facility]

## 2016-04-13 NOTE — Telephone Encounter (Signed)
[facility]

## 2016-04-17 ENCOUNTER — Ambulatory Visit (HOSPITAL_BASED_OUTPATIENT_CLINIC_OR_DEPARTMENT_OTHER)
Admission: RE | Admit: 2016-04-17 | Discharge: 2016-04-17 | Disposition: A | Discharge status: Home / Self Care | Source: Ambulatory Visit | Attending: Family Medicine | Admitting: Family Medicine

## 2016-04-17 DIAGNOSIS — X58XXXA Exposure to other specified factors, initial encounter: Secondary | ICD-10-CM | POA: Insufficient documentation

## 2016-04-17 DIAGNOSIS — S83282A Other tear of lateral meniscus, current injury, left knee, initial encounter: Secondary | ICD-10-CM | POA: Diagnosis not present

## 2016-04-17 DIAGNOSIS — S8992XD Unspecified injury of left lower leg, subsequent encounter: Secondary | ICD-10-CM

## 2016-04-17 DIAGNOSIS — M25461 Effusion, right knee: Secondary | ICD-10-CM | POA: Diagnosis not present

## 2016-04-19 ENCOUNTER — Encounter: Payer: Self-pay | Admitting: Family Medicine

## 2016-04-19 NOTE — Progress Notes (Signed)
MRI reviewed and discussed with patient.  She has both medial and lateral meniscus tears from her work injury.  ACL, PCL, other structures of knee appear normal.  Will refer her to orthopedic surgeon to discuss arthroscopic debridement.

## 2016-04-20 ENCOUNTER — Telehealth: Payer: Self-pay | Admitting: Family Medicine

## 2016-04-20 NOTE — Telephone Encounter (Signed)
Her left knee injury would not be a life threatening situation.  I could not write a letter stating this.
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
## 2016-04-21 NOTE — Telephone Encounter (Signed)
Spoke to patient and told her that we could not write the letter.

## 2016-04-23 NOTE — Addendum Note (Signed)
Addended by: Kathi SimpersWISE, Anayah Arvanitis F on: 04/23/2016 08:35 AM   Modules accepted: Orders

## 2016-04-28 ENCOUNTER — Ambulatory Visit: Payer: Worker's Compensation | Admitting: Family Medicine

## 2016-05-11 ENCOUNTER — Ambulatory Visit (INDEPENDENT_AMBULATORY_CARE_PROVIDER_SITE_OTHER): Admitting: Family Medicine

## 2016-05-11 ENCOUNTER — Ambulatory Visit: Payer: Worker's Compensation | Admitting: Family Medicine

## 2016-05-11 DIAGNOSIS — S83242D Other tear of medial meniscus, current injury, left knee, subsequent encounter: Secondary | ICD-10-CM

## 2016-05-11 DIAGNOSIS — S83282D Other tear of lateral meniscus, current injury, left knee, subsequent encounter: Secondary | ICD-10-CM | POA: Diagnosis not present

## 2016-05-11 DIAGNOSIS — S8992XD Unspecified injury of left lower leg, subsequent encounter: Secondary | ICD-10-CM

## 2016-05-11 NOTE — Patient Instructions (Signed)
You still need to see the orthopedic surgeon to have arthroscopic surgery on this knee. We will submit the paperwork again.

## 2016-05-12 NOTE — Assessment & Plan Note (Signed)
medial and lateral meniscus tears caused by work injury.  Referral was placed to orthopedic surgery to discuss arthroscopic debridement but she has not heard about this - will submit again.  Icing, knee brace.  Meloxicam if needed.  F/u prn with us - further follow up to come from orthopedic surgeon.

## 2016-05-12 NOTE — Progress Notes (Signed)
PCP: No PCP Per Patient  Subjective:   HPI: Patient is a 25 y.o. female here for left leg injury.  9/5: Patient reports on 9/2 she was at work - at top level of a dock, tried to step down on steps and missed a step. Felt and heard a crack in left knee. Pain level 7/10, sharp anteriorly. Knee feels unstable, like it needs to be popped. Been using ACE wrap, crutch, naproxen, muscle relaxant. No prior left knee injuries. No skin changes, numbness.  9/20: Patient reports she can walk a little better than before but still struggling a lot. Knee feels unstable. Cannot bend knee. Taking ibuprofen or naproxen, using muscle relaxant. Using knee brace with a cane. Pain worse with walking. Pain 5/10, sharp, anterior. No skin changes, numbness.  10/12: Patient reports her pain level is still fairly severe at 6/10, sharp, anterior. Difficulty walking. Knee still feels unstable. Also knee is swollen. No skin changes, numbness. Has not had MRI yet.  12/19: Patient returns still having not heard about orthopedic referral. Pain 4/10, still swelling. Pain radiates behind the knee. Still getting locking, feeling of instability. No skin changes, numbness.  Past Medical History:  Diagnosis Date  . Anemia     Current Outpatient Prescriptions on File Prior to Visit  Medication Sig Dispense Refill  . albuterol (PROVENTIL HFA;VENTOLIN HFA) 108 (90 Base) MCG/ACT inhaler Inhale 1-2 puffs into the lungs every 6 (six) hours as needed for wheezing or shortness of breath. 1 Inhaler 0  . escitalopram (LEXAPRO) 10 MG tablet Take 10 mg by mouth daily.    Marland Kitchen. HYDROcodone-acetaminophen (NORCO) 5-325 MG tablet Take 1 tablet by mouth every 6 (six) hours as needed for moderate pain. 20 tablet 0  . meloxicam (MOBIC) 15 MG tablet Take 1 tablet (15 mg total) by mouth daily. 30 tablet 1   No current facility-administered medications on file prior to visit.     Past Surgical History:  Procedure Laterality  Date  . wisdom teeth removal      No Known Allergies  Social History   Social History  . Marital status: Single    Spouse name: N/A  . Number of children: N/A  . Years of education: N/A   Occupational History  . Not on file.   Social History Main Topics  . Smoking status: Never Smoker  . Smokeless tobacco: Never Used  . Alcohol use No  . Drug use: No  . Sexual activity: Not Currently    Birth control/ protection: None   Other Topics Concern  . Not on file   Social History Narrative  . No narrative on file    No family history on file.  BP 123/83   Pulse 85   Ht 5\' 8"  (1.727 m)   Wt (!) 360 lb (163.3 kg)   BMI 54.74 kg/m   Review of Systems: See HPI above.    Objective:  Physical Exam:  Gen: NAD, comfortable in exam room  Left knee: Mod effusion.  No bruising, other deformity. TTP medial and lateral joint lines. ROM limited to 0-110 degrees. Negative ant/post drawers. Negative valgus/varus testing. Negative lachmanns. Positive mcmurrays, apleys. NV intact distally.  Right knee: FROM without pain.    Assessment & Plan:  1. Left knee injury - medial and lateral meniscus tears caused by work injury.  Referral was placed to orthopedic surgery to discuss arthroscopic debridement but she has not heard about this - will submit again.  Icing, knee brace.  Meloxicam  if needed.  F/u prn with us - further follow up to come from orthopedic surgeon.

## 2016-06-03 ENCOUNTER — Telehealth: Payer: Self-pay | Admitting: Family Medicine

## 2016-06-04 NOTE — Telephone Encounter (Signed)
Fax sent.

## 2016-06-11 ENCOUNTER — Telehealth: Payer: Self-pay | Admitting: Family Medicine

## 2016-06-23 ENCOUNTER — Telehealth: Payer: Self-pay | Admitting: Family Medicine

## 2016-06-29 ENCOUNTER — Telehealth: Payer: Self-pay | Admitting: Family Medicine

## 2016-06-30 ENCOUNTER — Ambulatory Visit: Admitting: Family Medicine

## 2016-06-30 NOTE — Telephone Encounter (Signed)
Spoke to [employer]olic[employer] nurse. Appointment made to see surgeon.

## 2016-07-01 ENCOUNTER — Ambulatory Visit (INDEPENDENT_AMBULATORY_CARE_PROVIDER_SITE_OTHER): Admitting: Family Medicine

## 2016-07-01 ENCOUNTER — Encounter: Payer: Self-pay | Admitting: Family Medicine

## 2016-07-01 DIAGNOSIS — S8992XD Unspecified injury of left lower leg, subsequent encounter: Secondary | ICD-10-CM

## 2016-07-01 NOTE — Patient Instructions (Signed)
I don't think you should return to anything in the truck yet until the meniscus tear is addressed. Keep appointment with Dr. Roda ShuttersXu tomorrow and go from there - he will review what surgery would entail, risks/benefits, recovery, etc. Call me if you need anything though you will likely continue follow up with him from this point forward instead of me.

## 2016-07-02 ENCOUNTER — Ambulatory Visit (INDEPENDENT_AMBULATORY_CARE_PROVIDER_SITE_OTHER): Admitting: Orthopaedic Surgery

## 2016-07-02 ENCOUNTER — Encounter (INDEPENDENT_AMBULATORY_CARE_PROVIDER_SITE_OTHER): Payer: Self-pay | Admitting: Orthopaedic Surgery

## 2016-07-02 ENCOUNTER — Telehealth (INDEPENDENT_AMBULATORY_CARE_PROVIDER_SITE_OTHER): Payer: Self-pay | Admitting: Orthopaedic Surgery

## 2016-07-02 DIAGNOSIS — G8929 Other chronic pain: Secondary | ICD-10-CM | POA: Insufficient documentation

## 2016-07-02 DIAGNOSIS — M25562 Pain in left knee: Secondary | ICD-10-CM

## 2016-07-02 DIAGNOSIS — S83282A Other tear of lateral meniscus, current injury, left knee, initial encounter: Secondary | ICD-10-CM | POA: Diagnosis not present

## 2016-07-02 NOTE — Progress Notes (Signed)
   Office Visit Note   Patient: Leslie Reyes           Date of Birth: [birth year]           MRN: [id number] Visit Date: 07/02/2016              Requested by: No referring provider defined for this encounter. PCP: No PCP Per Patient   Assessment & Plan: Visit Diagnoses:  1. Acute lateral meniscus tear of left knee, initial encounter     Plan: symptomatic LMT of left knee, failure to improve with conservative treatment.  Recommend arthroscopic PLM.  Discussed r/b/a.  Patient denies h/o DVT or PE.  Will submit claim with work comp.  Follow-Up Instructions: Return if symptoms worsen or fail to improve.   Orders:  No orders of the defined types were placed in this encounter.  No orders of the defined types were placed in this encounter.     Procedures: No procedures performed   Clinical Data: No additional findings.   Subjective: Chief Complaint  Patient presents with  . 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  She has been seeing Dr. Pearletha ForgeHudnall for this.  She has trouble flexing her left knee and has mechanical sxs.  Pain is worse with weight bearing.  Currently doing desk duty.      Review of Systems  Constitutional: Negative.   HENT: Negative.   Eyes: Negative.   Respiratory: Negative.   Cardiovascular: Negative.   Endocrine: Negative.   Musculoskeletal: Negative.   Neurological: Negative.   Hematological: Negative.   Psychiatric/Behavioral: Negative.   All other systems reviewed and are negative.    Objective: Vital Signs: There were no vitals taken for this visit.  Physical Exam  Constitutional: She is oriented to person, place, and time. She appears well-developed and well-nourished.  HENT:  Head: Normocephalic and atraumatic.  Eyes: EOM are normal.  Neck: Neck supple.  Pulmonary/Chest: Effort normal.  Abdominal: Soft.  Neurological: She  is alert and oriented to person, place, and time.  Skin: Skin is warm. Capillary refill takes less than 2 seconds.  Psychiatric: She has a normal mood and affect. Her behavior is normal. Judgment and thought content normal.  Nursing note and vitals reviewed.   Ortho Exam Left knee exam: - no effusion - limited ROM due to pain laterally - equivocal mcmurray due to patient's participation Specialty Comments:  No specialty comments available.  Imaging: No results found.   PMFS History: Patient Active Problem List   Diagnosis Date Noted  . Chronic pain of left knee 07/02/2016  . Acute lateral meniscus tear of left knee 07/02/2016  . Left knee injury 01/29/2016   Past Medical History:  Diagnosis Date  . Anemia     No family history on file.  Past Surgical History:  Procedure Laterality Date  . wisdom teeth removal     Social History   Occupational History  . Not on file.   Social History Main Topics  . Smoking status: Never Smoker  . Smokeless tobacco: Never Used  . Alcohol use No  . Drug use: No  . Sexual activity: Not Currently    Birth control/ protection: None

## 2016-07-02 NOTE — Telephone Encounter (Signed)
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 Arline AspCindy is requesting plan of care note, office note, work status. The number to contact her is [phone]  The fax # is [phone]

## 2016-07-05 NOTE — Assessment & Plan Note (Signed)
medial and lateral meniscus tears caused by work injury.  Has appointment to see Dr. Roda ShuttersXu tomorrow to discuss arthroscopy.  Continue meloxicam if needed.  F/u with us as needed.  Would continue current work restrictions, defer further recommendation to Dr. Roda ShuttersXu.

## 2016-07-05 NOTE — Progress Notes (Signed)
PCP: No PCP Per Patient  Subjective:   HPI: Patient is a 26 y.o. female here for left leg injury.  9/5: Patient reports on 9/2 she was at work - at top level of a dock, tried to step down on steps and missed a step. Felt and heard a crack in left knee. Pain level 7/10, sharp anteriorly. Knee feels unstable, like it needs to be popped. Been using ACE wrap, crutch, naproxen, muscle relaxant. No prior left knee injuries. No skin changes, numbness.  9/20: Patient reports she can walk a little better than before but still struggling a lot. Knee feels unstable. Cannot bend knee. Taking ibuprofen or naproxen, using muscle relaxant. Using knee brace with a cane. Pain worse with walking. Pain 5/10, sharp, anterior. No skin changes, numbness.  10/12: Patient reports her pain level is still fairly severe at 6/10, sharp, anterior. Difficulty walking. Knee still feels unstable. Also knee is swollen. No skin changes, numbness. Has not had MRI yet.  12/19: Patient returns still having not heard about orthopedic referral. Pain 4/10, still swelling. Pain radiates behind the knee. Still getting locking, feeling of instability. No skin changes, numbness.  07/01/16: Patient returns for continued issues with left knee. Pain level up to 6/10, worse with twisting. Radiates posteriorly. + swelling. Also worse with bearing weight. No skin changes, numbness. Has appointment with surgeon for tomorrow, Dr. Roda Shutters.  Past Medical History:  Diagnosis Date  . Anemia     Current Outpatient Prescriptions on File Prior to Visit  Medication Sig Dispense Refill  . albuterol (PROVENTIL HFA;VENTOLIN HFA) 108 (90 Base) MCG/ACT inhaler Inhale 1-2 puffs into the lungs every 6 (six) hours as needed for wheezing or shortness of breath. 1 Inhaler 0  . escitalopram (LEXAPRO) 10 MG tablet Take 10 mg by mouth daily.    Marland Kitchen HYDROcodone-acetaminophen (NORCO) 5-325 MG tablet Take 1 tablet by mouth every 6 (six)  hours as needed for moderate pain. (Patient not taking: Reported on 07/02/2016) 20 tablet 0  . meloxicam (MOBIC) 15 MG tablet Take 1 tablet (15 mg total) by mouth daily. (Patient not taking: Reported on 07/02/2016) 30 tablet 1   No current facility-administered medications on file prior to visit.     Past Surgical History:  Procedure Laterality Date  . wisdom teeth removal      No Known Allergies  Social History   Social History  . Marital status: Single    Spouse name: N/A  . Number of children: N/A  . Years of education: N/A   Occupational History  . Not on file.   Social History Main Topics  . Smoking status: Never Smoker  . Smokeless tobacco: Never Used  . Alcohol use No  . Drug use: No  . Sexual activity: Not Currently    Birth control/ protection: None   Other Topics Concern  . Not on file   Social History Narrative  . No narrative on file    No family history on file.  BP (!) 153/100   Pulse 77   Ht 5\' 8"  (1.727 m)   Wt (!) 360 lb (163.3 kg)   BMI 54.74 kg/m   Review of Systems: See HPI above.    Objective:  Physical Exam:  Gen: NAD, comfortable in exam room  Left knee: Mild effusion.  No bruising, other deformity. TTP medial and lateral joint lines. ROM limited to 0-110 degrees. Negative ant/post drawers. Negative valgus/varus testing. Negative lachmanns. Positive mcmurrays, apleys. NV intact distally.  Right knee:  FROM without pain.    Assessment & Plan:  1. Left knee injury - medial and lateral meniscus tears caused by work injury.  Has appointment to see Dr. Roda ShuttersXu tomorrow to discuss arthroscopy.  Continue meloxicam if needed.  F/u with us as needed.  Would continue current work restrictions, defer further recommendation to Dr. Roda ShuttersXu.

## 2016-07-06 ENCOUNTER — Telehealth (INDEPENDENT_AMBULATORY_CARE_PROVIDER_SITE_OTHER): Payer: Self-pay | Admitting: Orthopaedic Surgery

## 2016-07-06 NOTE — Telephone Encounter (Signed)
Faxed

## 2016-07-06 NOTE — Telephone Encounter (Signed)
See other msg

## 2016-07-06 NOTE — Telephone Encounter (Signed)
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 Cindy is requesting plan of care note, office note, work status. The number to contact her is [phone]  The fax # is [phone] °

## 2016-07-08 NOTE — Telephone Encounter (Signed)
Called Arline AspCindy  And read the office visit note.

## 2016-07-15 ENCOUNTER — Encounter (INDEPENDENT_AMBULATORY_CARE_PROVIDER_SITE_OTHER): Payer: Self-pay | Admitting: Family Medicine

## 2016-07-21 ENCOUNTER — Telehealth: Payer: Self-pay | Admitting: Family Medicine

## 2016-07-21 NOTE — Telephone Encounter (Signed)
Patient dropped off Disability Insurance Claim paperwork to be filled out

## 2016-07-21 NOTE — Telephone Encounter (Signed)
Spoke to patient and told her that paper needs to be filled out by Dr. Warren DanesXu's office. Stated she would pick it up on Monday.

## 2016-07-21 NOTE — Telephone Encounter (Signed)
Paperwork should now be filled out by Dr. Warren DanesXu's office - plan is for patient to have arthroscopic partial meniscectomy.

## 2016-08-11 ENCOUNTER — Ambulatory Visit (INDEPENDENT_AMBULATORY_CARE_PROVIDER_SITE_OTHER): Payer: Self-pay | Admitting: Family Medicine

## 2016-08-11 ENCOUNTER — Encounter (INDEPENDENT_AMBULATORY_CARE_PROVIDER_SITE_OTHER): Payer: Self-pay

## 2016-08-23 ENCOUNTER — Ambulatory Visit (INDEPENDENT_AMBULATORY_CARE_PROVIDER_SITE_OTHER): Payer: Self-pay | Admitting: Family Medicine

## 2016-08-23 ENCOUNTER — Encounter (INDEPENDENT_AMBULATORY_CARE_PROVIDER_SITE_OTHER): Payer: Self-pay

## 2016-08-26 ENCOUNTER — Other Ambulatory Visit (INDEPENDENT_AMBULATORY_CARE_PROVIDER_SITE_OTHER): Payer: Self-pay | Admitting: Orthopaedic Surgery

## 2016-08-26 DIAGNOSIS — S83282D Other tear of lateral meniscus, current injury, left knee, subsequent encounter: Secondary | ICD-10-CM

## 2016-08-27 ENCOUNTER — Encounter (HOSPITAL_COMMUNITY)
Admission: RE | Admit: 2016-08-27 | Discharge: 2016-08-27 | Disposition: A | Discharge status: Home / Self Care | Source: Ambulatory Visit | Attending: Orthopaedic Surgery | Admitting: Orthopaedic Surgery

## 2016-08-27 ENCOUNTER — Encounter (HOSPITAL_COMMUNITY): Payer: Self-pay

## 2016-08-27 DIAGNOSIS — Z01812 Encounter for preprocedural laboratory examination: Secondary | ICD-10-CM | POA: Insufficient documentation

## 2016-08-27 HISTORY — DX: Prediabetes: R73.03

## 2016-08-27 HISTORY — DX: Depression, unspecified: F32.A

## 2016-08-27 HISTORY — DX: Morbid (severe) obesity due to excess calories: E66.01

## 2016-08-27 HISTORY — DX: Other tear of lateral meniscus, current injury, left knee, initial encounter: S83.282A

## 2016-08-27 HISTORY — DX: Anxiety disorder, unspecified: F41.9

## 2016-08-27 HISTORY — DX: Pneumonia, unspecified organism: J18.9

## 2016-08-27 HISTORY — DX: Gastro-esophageal reflux disease without esophagitis: K21.9

## 2016-08-27 HISTORY — DX: Body Mass Index (BMI) 40.0 and over, adult: Z684

## 2016-08-27 HISTORY — DX: Presence of spectacles and contact lenses: Z97.3

## 2016-08-27 HISTORY — DX: Major depressive disorder, single episode, unspecified: F32.9

## 2016-08-27 LAB — BASIC METABOLIC PANEL
Anion gap: 9 (ref 5–15)
BUN: 16 mg/dL (ref 6–20)
CALCIUM: 9.1 mg/dL (ref 8.9–10.3)
CO2: 22 mmol/L (ref 22–32)
Chloride: 107 mmol/L (ref 101–111)
Creatinine, Ser: 0.75 mg/dL (ref 0.44–1.00)
GFR calc Af Amer: >60 mL/min (ref >60)
GFR calc non Af Amer: >60 mL/min (ref >60)
Glucose, Bld: 92 mg/dL (ref 65–99)
Potassium: 4.6 mmol/L (ref 3.5–5.1)
SODIUM: 138 mmol/L (ref 135–145)

## 2016-08-27 LAB — CBC
HCT: 38.5 % (ref 36.0–46.0)
Hemoglobin: 12.6 g/dL (ref 12.0–15.0)
MCH: 25.2 pg — AB (ref 26.0–34.0)
MCHC: 32.7 g/dL (ref 30.0–36.0)
MCV: 77 fL — ABNORMAL LOW (ref 78.0–100.0)
Platelets: ADEQUATE 10*3/uL (ref 150–400)
RBC: 5 MIL/uL (ref 3.87–5.11)
RDW: 14.7 % (ref 11.5–15.5)
WBC: 6.3 10*3/uL (ref 4.0–10.5)

## 2016-08-27 LAB — HCG, SERUM, QUALITATIVE: PREG SERUM: NEGATIVE

## 2016-08-27 NOTE — Pre-Procedure Instructions (Addendum)
Leslie Reyes  08/27/2016      [id number] [place], [place] - [address] [place] [address] Phone: [phone] Fax: [phone]  [id number] - [place], [place] - [address] [place] [address] Phone: [phone] Fax: [phone]    Your procedure is scheduled on Thurs, April 12 @ 12:15 PM  Report to [facility] Admitting at 10:15 AM  Call this number if you have problems the morning of surgery:  [phone]   Remember:  Do not eat food or drink liquids after midnight.  Take these medicines the morning of surgery with A SIP OF WATER : if needed: acetaminophen (TYLENOL) for pain, albuterol (PROVENTIL HFA;VENTOLIN HFA)  Inhaler for wheezing or shortness of breath ( Bring inhaler in with you on day of procedure)             No Goody's, BC's, Aleve, Advil, Motrin, Ibuprofen, Fish Oil, vitamins, or any Herbal Medications; stop now.   Do not wear jewelry, make-up or nail polish.  Do not wear lotions, powders, or perfumes, or deoderant.  Do not shave 48 hours prior to surgery.    Do not bring valuables to the hospital.  [facility] is not responsible for any belongings or valuables.  Contacts, dentures or bridgework may not be worn into surgery.  Leave your suitcase in the car.  After surgery it may be brought to your room.  For patients admitted to the hospital, discharge time will be determined by your treatment team.  Patients discharged the day of surgery will not be allowed to drive home.    [facility]  Before surgery, you can play an important role.  Because skin is not sterile, your skin needs to be as free of germs as possible.  You can reduce the number of germs on you skin by washing with CHG (chlorahexidine gluconate) soap before surgery.  CHG is an antiseptic cleaner which kills germs and  bonds with the skin to continue killing germs even after washing.  Please DO NOT use if you have an allergy to CHG or antibacterial soaps.  If your skin becomes reddened/irritated stop using the CHG and inform your nurse when you arrive at Short Stay.  Do not shave (including legs and underarms) for at least 48 hours prior to the first CHG shower.  You may shave your face.  Please follow these instructions carefully:   1.  Shower with CHG Soap the night before surgery and the                                morning of Surgery.  2.  If you choose to wash your hair, wash your hair first as usual with your       normal shampoo.  3.  After you shampoo, rinse your hair and body thoroughly to remove the                      Shampoo.  4.  Use CHG as you would any other liquid soap.  You can apply chg directly       to the skin and wash gently with scrungie or a clean washcloth.  5.  Apply the CHG Soap to your body ONLY FROM THE NECK DOWN.  Do not use on open wounds or open sores.  Avoid contact with your eyes,       ears, mouth and genitals (private parts).  Wash genitals (private parts)       with your normal soap.  6.  Wash thoroughly, paying special attention to the area where your surgery        will be performed.  7.  Thoroughly rinse your body with warm water from the neck down.  8.  DO NOT shower/wash with your normal soap after using and rinsing off       the CHG Soap.  9.  Pat yourself dry with a clean towel.            10.  Wear clean pajamas.            11.  Place clean sheets on your bed the night of your first shower and do not        sleep with pets.  Day of Surgery  Do not apply any lotions/deoderants the morning of surgery.  Please wear clean clothes to the hospital/surgery center.   Please read over the following fact sheets that you were given. Pain Booklet and Coughing and Deep Breathing, surgical site infection prevention

## 2016-08-27 NOTE — Progress Notes (Signed)
Cardiologist   Medical Md  Echo   Stress test  Heart cath  EKG  CXR in epic from 10-10-15

## 2016-08-27 NOTE — Progress Notes (Signed)
Pt denies SOB, chest pain, and being under the care of a cardiologist. Pt denies having a stress test, echo and cardiac cath. Pt denies having an EKG within the last year. Pt denies having recent labs. Pt labs at PAT ( platelet clumps). A1c pending. Pt PCP is Arnette Felts, FNP at [facility]. Pt chart forwarded to anesthesia ( BMI 60.96).

## 2016-08-29 LAB — HEMOGLOBIN A1C
Hgb A1c MFr Bld: 5.9 % — ABNORMAL HIGH (ref 4.8–5.6)
MEAN PLASMA GLUCOSE: 123 mg/dL

## 2016-09-01 ENCOUNTER — Other Ambulatory Visit (HOSPITAL_COMMUNITY): Payer: Self-pay

## 2016-09-01 MED ORDER — CEFAZOLIN SODIUM-DEXTROSE 2-4 GM/100ML-% IV SOLN
2.0000 g | INTRAVENOUS | Status: AC
  Administered 2016-09-02: 3 g via INTRAVENOUS
  Filled 2016-09-01: qty 100

## 2016-09-02 ENCOUNTER — Ambulatory Visit (HOSPITAL_COMMUNITY): Admitting: Certified Registered Nurse Anesthetist

## 2016-09-02 ENCOUNTER — Encounter (HOSPITAL_COMMUNITY)
Admission: RE | Disposition: A | Discharge status: Home / Self Care | Payer: Self-pay | Source: Ambulatory Visit | Attending: Orthopaedic Surgery

## 2016-09-02 ENCOUNTER — Ambulatory Visit (HOSPITAL_COMMUNITY): Admitting: Vascular Surgery

## 2016-09-02 ENCOUNTER — Ambulatory Visit (HOSPITAL_COMMUNITY)
Admission: RE | Admit: 2016-09-02 | Discharge: 2016-09-02 | Disposition: A | Discharge status: Home / Self Care | Source: Ambulatory Visit | Attending: Orthopaedic Surgery | Admitting: Orthopaedic Surgery

## 2016-09-02 DIAGNOSIS — Z79899 Other long term (current) drug therapy: Secondary | ICD-10-CM | POA: Diagnosis not present

## 2016-09-02 DIAGNOSIS — R7303 Prediabetes: Secondary | ICD-10-CM | POA: Insufficient documentation

## 2016-09-02 DIAGNOSIS — X58XXXA Exposure to other specified factors, initial encounter: Secondary | ICD-10-CM | POA: Insufficient documentation

## 2016-09-02 DIAGNOSIS — F419 Anxiety disorder, unspecified: Secondary | ICD-10-CM | POA: Diagnosis not present

## 2016-09-02 DIAGNOSIS — S83242A Other tear of medial meniscus, current injury, left knee, initial encounter: Secondary | ICD-10-CM

## 2016-09-02 DIAGNOSIS — M659 Synovitis and tenosynovitis, unspecified: Secondary | ICD-10-CM | POA: Diagnosis not present

## 2016-09-02 DIAGNOSIS — M65962 Unspecified synovitis and tenosynovitis, left lower leg: Secondary | ICD-10-CM

## 2016-09-02 DIAGNOSIS — Z6841 Body Mass Index (BMI) 40.0 and over, adult: Secondary | ICD-10-CM | POA: Diagnosis not present

## 2016-09-02 DIAGNOSIS — S83282A Other tear of lateral meniscus, current injury, left knee, initial encounter: Secondary | ICD-10-CM | POA: Insufficient documentation

## 2016-09-02 DIAGNOSIS — Z791 Long term (current) use of non-steroidal anti-inflammatories (NSAID): Secondary | ICD-10-CM | POA: Diagnosis not present

## 2016-09-02 DIAGNOSIS — F329 Major depressive disorder, single episode, unspecified: Secondary | ICD-10-CM | POA: Insufficient documentation

## 2016-09-02 DIAGNOSIS — S83282D Other tear of lateral meniscus, current injury, left knee, subsequent encounter: Secondary | ICD-10-CM

## 2016-09-02 HISTORY — PX: KNEE ARTHROSCOPY: SHX127

## 2016-09-02 LAB — GLUCOSE, CAPILLARY: Glucose-Capillary: 102 mg/dL — ABNORMAL HIGH (ref 65–99)

## 2016-09-02 SURGERY — ARTHROSCOPY, KNEE
Anesthesia: General | Site: Knee | Laterality: Left

## 2016-09-02 MED ORDER — LACTATED RINGERS IV SOLN
INTRAVENOUS | Status: DC
  Administered 2016-09-02 (×2): via INTRAVENOUS

## 2016-09-02 MED ORDER — DEXAMETHASONE SODIUM PHOSPHATE 10 MG/ML IJ SOLN
INTRAMUSCULAR | Status: DC | PRN
  Administered 2016-09-02: 10 mg via INTRAVENOUS

## 2016-09-02 MED ORDER — PROMETHAZINE HCL 25 MG PO TABS: 25.0000 mg | ORAL_TABLET | Freq: Four times a day (QID) | ORAL | 1 refills | Status: AC | PRN

## 2016-09-02 MED ORDER — MIDAZOLAM HCL 2 MG/2ML IJ SOLN
INTRAMUSCULAR | Status: AC
  Filled 2016-09-02: qty 2

## 2016-09-02 MED ORDER — PROMETHAZINE HCL 25 MG/ML IJ SOLN: 6.2500 mg | INTRAMUSCULAR | Status: DC | PRN

## 2016-09-02 MED ORDER — ONDANSETRON HCL 4 MG/2ML IJ SOLN
INTRAMUSCULAR | Status: DC | PRN
  Administered 2016-09-02: 4 mg via INTRAVENOUS

## 2016-09-02 MED ORDER — FENTANYL CITRATE (PF) 100 MCG/2ML IJ SOLN
INTRAMUSCULAR | Status: DC | PRN
  Administered 2016-09-02 (×2): 100 ug via INTRAVENOUS

## 2016-09-02 MED ORDER — MIDAZOLAM HCL 2 MG/2ML IJ SOLN
INTRAMUSCULAR | Status: DC | PRN
  Administered 2016-09-02: 2 mg via INTRAVENOUS

## 2016-09-02 MED ORDER — HYDROMORPHONE HCL 1 MG/ML IJ SOLN
0.2500 mg | INTRAMUSCULAR | Status: DC | PRN
  Administered 2016-09-02 (×2): 0.5 mg via INTRAVENOUS

## 2016-09-02 MED ORDER — PROPOFOL 10 MG/ML IV BOLUS
INTRAVENOUS | Status: AC
  Filled 2016-09-02: qty 40

## 2016-09-02 MED ORDER — SODIUM CHLORIDE 0.9 % IR SOLN
Status: DC | PRN
  Administered 2016-09-02: 6000 mL

## 2016-09-02 MED ORDER — ONDANSETRON HCL 4 MG/2ML IJ SOLN: INTRAMUSCULAR | Status: DC | PRN

## 2016-09-02 MED ORDER — CEFAZOLIN IN D5W 1 GM/50ML IV SOLN
INTRAVENOUS | Status: AC
  Filled 2016-09-02: qty 50

## 2016-09-02 MED ORDER — SUGAMMADEX SODIUM 200 MG/2ML IV SOLN
INTRAVENOUS | Status: DC | PRN
  Administered 2016-09-02: 365 mg via INTRAVENOUS

## 2016-09-02 MED ORDER — SENNOSIDES-DOCUSATE SODIUM 8.6-50 MG PO TABS: 1.0000 | ORAL_TABLET | Freq: Every evening | ORAL | 1 refills | Status: AC | PRN

## 2016-09-02 MED ORDER — ALBUTEROL SULFATE HFA 108 (90 BASE) MCG/ACT IN AERS
INHALATION_SPRAY | RESPIRATORY_TRACT | Status: DC | PRN
  Administered 2016-09-02: 4 via RESPIRATORY_TRACT

## 2016-09-02 MED ORDER — HYDROMORPHONE HCL 1 MG/ML IJ SOLN
INTRAMUSCULAR | Status: AC
  Filled 2016-09-02: qty 1

## 2016-09-02 MED ORDER — FENTANYL CITRATE (PF) 250 MCG/5ML IJ SOLN
INTRAMUSCULAR | Status: AC
  Filled 2016-09-02: qty 5

## 2016-09-02 MED ORDER — LIDOCAINE 2% (20 MG/ML) 5 ML SYRINGE
INTRAMUSCULAR | Status: DC | PRN
Start: 2016-09-02 — End: 2016-09-02
  Administered 2016-09-02: 75 mg via INTRAVENOUS

## 2016-09-02 MED ORDER — SUCCINYLCHOLINE CHLORIDE 200 MG/10ML IV SOSY
PREFILLED_SYRINGE | INTRAVENOUS | Status: DC | PRN
  Administered 2016-09-02: 160 mg via INTRAVENOUS

## 2016-09-02 MED ORDER — PROPOFOL 10 MG/ML IV BOLUS
INTRAVENOUS | Status: DC | PRN
  Administered 2016-09-02: 290 mg via INTRAVENOUS

## 2016-09-02 MED ORDER — HYDROCODONE-ACETAMINOPHEN 5-325 MG PO TABS: 1.0000 | ORAL_TABLET | Freq: Four times a day (QID) | ORAL | 0 refills | Status: AC | PRN

## 2016-09-02 MED ORDER — ROCURONIUM BROMIDE 100 MG/10ML IV SOLN
INTRAVENOUS | Status: DC | PRN
  Administered 2016-09-02: 40 mg via INTRAVENOUS
  Administered 2016-09-02: 10 mg via INTRAVENOUS

## 2016-09-02 MED ORDER — ONDANSETRON HCL 4 MG PO TABS: 4.0000 mg | ORAL_TABLET | Freq: Three times a day (TID) | ORAL | 0 refills | Status: AC | PRN

## 2016-09-02 SURGICAL SUPPLY — 41 items
BANDAGE ACE 6X5 VEL STRL LF (GAUZE/BANDAGES/DRESSINGS) IMPLANT
BANDAGE ESMARK 6X9 LF (GAUZE/BANDAGES/DRESSINGS) ×1 IMPLANT
BLADE CLIPPER SURG (BLADE) IMPLANT
BLADE CUDA 5.5 (BLADE) IMPLANT
BLADE CUTTER GATOR 3.5 (BLADE) ×3 IMPLANT
BLADE GREAT WHITE 4.2 (BLADE) IMPLANT
BLADE GREAT WHITE 4.2MM (BLADE)
BLADE SURG 11 STRL SS (BLADE) IMPLANT
BNDG ELASTIC 6X15 VLCR STRL LF (GAUZE/BANDAGES/DRESSINGS) ×3 IMPLANT
BNDG ESMARK 6X9 LF (GAUZE/BANDAGES/DRESSINGS) ×3
BUR OVAL 6.0 (BURR) IMPLANT
COVER SURGICAL LIGHT HANDLE (MISCELLANEOUS) ×3 IMPLANT
CUFF TOURNIQUET SINGLE 34IN LL (TOURNIQUET CUFF) IMPLANT
CUFF TOURNIQUET SINGLE 44IN (TOURNIQUET CUFF) ×3 IMPLANT
DRAPE ARTHROSCOPY W/POUCH 114 (DRAPES) ×3 IMPLANT
DRAPE SURG 17X23 STRL (DRAPES) ×6 IMPLANT
DRAPE U-SHAPE 47X51 STRL (DRAPES) ×3 IMPLANT
DRSG PAD ABDOMINAL 8X10 ST (GAUZE/BANDAGES/DRESSINGS) ×3 IMPLANT
DURAPREP 26ML APPLICATOR (WOUND CARE) ×6 IMPLANT
ELECT CAUTERY BLADE 6.4 (BLADE) ×3 IMPLANT
FACESHIELD WRAPAROUND (MASK) ×3 IMPLANT
GAUZE SPONGE 4X4 12PLY STRL (GAUZE/BANDAGES/DRESSINGS) ×3 IMPLANT
GAUZE XEROFORM 1X8 LF (GAUZE/BANDAGES/DRESSINGS) ×3 IMPLANT
GLOVE SKINSENSE NS SZ7.5 (GLOVE) ×4
GLOVE SKINSENSE STRL SZ7.5 (GLOVE) ×2 IMPLANT
GOWN STRL REIN XL XLG (GOWN DISPOSABLE) ×3 IMPLANT
HOVERMATT SINGLE USE (MISCELLANEOUS) ×3 IMPLANT
KIT ROOM TURNOVER OR (KITS) ×3 IMPLANT
MANIFOLD NEPTUNE II (INSTRUMENTS) IMPLANT
NS IRRIG 1000ML POUR BTL (IV SOLUTION) IMPLANT
PACK ARTHROSCOPY DSU (CUSTOM PROCEDURE TRAY) ×3 IMPLANT
PAD ARMBOARD 7.5X6 YLW CONV (MISCELLANEOUS) ×9 IMPLANT
PADDING CAST COTTON 6X4 STRL (CAST SUPPLIES) ×3 IMPLANT
SET ARTHROSCOPY TUBING (MISCELLANEOUS) ×2
SET ARTHROSCOPY TUBING LN (MISCELLANEOUS) ×1 IMPLANT
SPONGE LAP 4X18 X RAY DECT (DISPOSABLE) ×3 IMPLANT
SUT ETHILON 3 0 PS 1 (SUTURE) ×3 IMPLANT
TOWEL OR 17X24 6PK STRL BLUE (TOWEL DISPOSABLE) ×3 IMPLANT
TOWEL OR 17X26 10 PK STRL BLUE (TOWEL DISPOSABLE) ×3 IMPLANT
WAND HAND CNTRL MULTIVAC 90 (MISCELLANEOUS) IMPLANT
WATER STERILE IRR 1000ML POUR (IV SOLUTION) ×3 IMPLANT

## 2016-09-02 NOTE — Anesthesia Procedure Notes (Addendum)
Procedure Name: Intubation Date/Time: 09/02/2016 12:36 PM Performed by: Orvilla Fus A Pre-anesthesia Checklist: Patient identified, Emergency Drugs available, Suction available and Patient being monitored Patient Re-evaluated:Patient Re-evaluated prior to inductionOxygen Delivery Method: Circle System Utilized Preoxygenation: Pre-oxygenation with 100% oxygen (2 min tight fitting facemask) Intubation Type: IV induction Ventilation: Mask ventilation without difficulty and Oral airway inserted - appropriate to patient size Laryngoscope Size: Glidescope and 4 Grade View: Grade I Tube type: Oral Tube size: 7.0 mm Number of attempts: 1 Airway Equipment and Method: Oral airway,  Rigid stylet and Video-laryngoscopy Placement Confirmation: ETT inserted through vocal cords under direct vision,  positive ETCO2 and breath sounds checked- equal and bilateral Secured at: 21 cm Tube secured with: Tape Dental Injury: Teeth and Oropharynx as per pre-operative assessment  Difficulty Due To: Difficulty was anticipated, Difficult Airway- due to large tongue, Difficult Airway- due to limited oral opening and Difficult Airway- due to reduced neck mobility Comments: Morbid obesity, 181 kg. Grade I view with glidescope and OETT passed through cords without difficulty.

## 2016-09-02 NOTE — Anesthesia Preprocedure Evaluation (Signed)
Anesthesia Evaluation  Patient identified by MRN, date of birth, ID band Patient awake    Reviewed: Allergy & Precautions, NPO status , Patient's Chart, lab work & pertinent test results  Airway Mallampati: II  TM Distance: <3 FB Neck ROM: Full    Dental no notable dental hx.    Pulmonary neg pulmonary ROS,    breath sounds clear to auscultation + decreased breath sounds      Cardiovascular negative cardio ROS Normal cardiovascular exam Rhythm:Regular Rate:Normal     Neuro/Psych negative neurological ROS  negative psych ROS   GI/Hepatic Neg liver ROS, GERD  ,  Endo/Other  Morbid obesity  Renal/GU negative Renal ROS  negative genitourinary   Musculoskeletal negative musculoskeletal ROS (+)   Abdominal   Peds negative pediatric ROS (+)  Hematology negative hematology ROS (+)   Anesthesia Other Findings   Reproductive/Obstetrics negative OB ROS                             Anesthesia Physical Anesthesia Plan  ASA: III  Anesthesia Plan: General   Post-op Pain Management:    Induction: Intravenous  Airway Management Planned: Oral ETT  Additional Equipment:   Intra-op Plan:   Post-operative Plan: Extubation in OR  Informed Consent: I have reviewed the patients History and Physical, chart, labs and discussed the procedure including the risks, benefits and alternatives for the proposed anesthesia with the patient or authorized representative who has indicated his/her understanding and acceptance.   Dental advisory given  Plan Discussed with: CRNA and Surgeon  Anesthesia Plan Comments:         Anesthesia Quick Evaluation

## 2016-09-02 NOTE — Transfer of Care (Signed)
Immediate Anesthesia Transfer of Care Note  Patient: Leslie Reyes  Procedure(s) Performed: Procedure(s): LEFT KNEE ARTHROSCOPY WITH PARTIAL LATERAL MENISCECTOMY (Left)  Patient Location: PACU  Anesthesia Type:General  Level of Consciousness: awake, alert  and oriented  Airway & Oxygen Therapy: Patient Spontanous Breathing and Patient connected to nasal cannula oxygen  Post-op Assessment: Report given to RN, Post -op Vital signs reviewed and stable and Patient moving all extremities  Post vital signs: Reviewed and stable  Last Vitals:  Vitals:   09/02/16 1102 09/02/16 1332  BP: 136/74 128/68  Pulse: 72 75  Resp: 18 (!) 25  Temp: 36.8 C 36.3 C    Last Pain:  Vitals:   09/02/16 1332  PainSc: Asleep      Patients Stated Pain Goal: 3 (09/02/16 1049)  Complications: No apparent anesthesia complications

## 2016-09-02 NOTE — Anesthesia Postprocedure Evaluation (Signed)
Anesthesia Post Note  Patient: Trecia Rogers  Procedure(s) Performed: Procedure(s) (LRB): LEFT KNEE ARTHROSCOPY WITH PARTIAL LATERAL MENISCECTOMY (Left)  Patient location during evaluation: PACU Anesthesia Type: General Level of consciousness: awake and alert Pain management: pain level controlled Vital Signs Assessment: post-procedure vital signs reviewed and stable Respiratory status: spontaneous breathing, nonlabored ventilation, respiratory function stable and patient connected to nasal cannula oxygen Cardiovascular status: blood pressure returned to baseline and stable Postop Assessment: no signs of nausea or vomiting Anesthetic complications: no       Last Vitals:  Vitals:   09/02/16 1400 09/02/16 1415  BP: 112/68 (!) 112/58  Pulse: (!) 58   Resp: (!) 22   Temp:  36.5 C    Last Pain:  Vitals:   09/02/16 1415  PainSc: Asleep    LLE Motor Response: Purposeful movement (09/02/16 1415) LLE Sensation: Full sensation (09/02/16 1415)          Dayshaun Whobrey S

## 2016-09-02 NOTE — Discharge Instructions (Signed)
° ° °Post-operative patient instructions  °Knee Arthroscopy  ° °• Ice:  Place intermittent ice or cooler pack over your knee, 30 minutes on and 30 minutes off.  Continue this for the first 72 hours after surgery, then save ice for use after therapy sessions or on more active days.   °• Weight:  You may bear weight on your leg as your symptoms allow. °• Crutches:  Use crutches (or walker) to assist in walking until told to discontinue by your physical therapist or physician. This will help to reduce pain. °• Strengthening:  Perform simple thigh squeezes (isometric quad contractions) and straight leg lifts as you are able (3 sets of 5 to 10 repetitions, 3 times a day).  For the leg lifts, have someone support under your ankle in the beginning until you have increased strength enough to do this on your own.  To help get started on thigh squeezes, place a pillow under your knee and push down on the pillow with back of knee (sometimes easier to do than with your leg fully straight). °• Motion:  Perform gentle knee motion as tolerated - this is gentle bending and straightening of the knee. Seated heel slides: you can start by sitting in a chair, remove your brace, and gently slide your heel back on the floor - allowing your knee to bend. Have someone help you straighten your knee (or use your other leg/foot hooked under your ankle.  °• Dressing:  Perform 1st dressing change at 2 days postoperative. A moderate amount of blood tinged drainage is to be expected.  So if you bleed through the dressing on the first or second day or if you have fevers, it is fine to change the dressing/check the wounds early and redress wound. Elevate your leg.  If it bleeds through again, or if the incisions are leaking frank blood, please call the office. May change dressing every 1-2 days thereafter to help watch wounds. Can purchase Tegderm (or 3M Nexcare) water resistant dressings at local pharmacy / [facility]. °• Shower:  Light shower is ok  after 2 days.  Please take shower, NO bath. Recover with gauze and ace wrap to help keep wounds protected.   °• Pain medication:  A narcotic pain medication has been prescribed.  Take as directed.  Typically you need narcotic pain medication more regularly during the first 3 to 5 days after surgery.  Decrease your use of the medication as the pain improves.  Narcotics can sometimes cause constipation, even after a few doses.  If you have problems with constipation, you can take an over the counter stool softener or light laxative.  If you have persistent problems, please notify your physician’s office. °• Physical therapy: Additional activity guidelines to be provided by your physician or physical therapist at follow-up visits.  °• Driving: Do not recommend driving x 2 weeks post surgical, especially if surgery performed on right side. Should not drive while taking narcotic pain medications. It typically takes at least 2 weeks to restore sufficient neuromuscular function for normal reaction times for driving safety.  °• Call [phone] for questions or problems. Evenings you will be forwarded to the hospital operator.  Ask for the orthopaedic physician on call. Please call if you experience:  °  °o Redness, foul smelling, or persistent drainage from the surgical site  °o worsening knee pain and swelling not responsive to medication  °o any calf pain and or swelling of the lower leg  °o temperatures greater than   101.5 F o other questions or concerns   Thank you for allowing us to be a part of your care.  

## 2016-09-02 NOTE — H&P (Signed)
    PREOPERATIVE H&P  Chief Complaint: left knee lateral meniscal tear  HPI: Leslie Reyes is a 26 y.o. female who presents for surgical treatment of left knee lateral meniscal tear.  She denies any changes in medical history.  Past Medical History:  Diagnosis Date  . Acute lateral meniscus tear of left knee   . Anemia   . Anxiety   . Depression   . GERD (gastroesophageal reflux disease)   . Morbid obesity with BMI of 60.0-69.9, adult (HCC)   . Pneumonia   . Pre-diabetes   . Wears glasses    Past Surgical History:  Procedure Laterality Date  . TONSILLECTOMY    . wisdom teeth removal    . WISDOM TOOTH EXTRACTION     Social History   Social History  . Marital status: Single    Spouse name: N/A  . Number of children: N/A  . Years of education: N/A   Social History Main Topics  . Smoking status: Never Smoker  . Smokeless tobacco: Never Used  . Alcohol use No  . Drug use: No  . Sexual activity: Not Currently    Birth control/ protection: None   Other Topics Concern  . Not on file   Social History Narrative  . No narrative on file   Family History  Problem Relation Age of Onset  . Hypertension Mother   . Diabetes Brother   . Deep vein thrombosis Other    Allergies  Allergen Reactions  . No Known Allergies    Prior to Admission medications   Medication Sig Start Date End Date Taking? Authorizing Provider  acetaminophen (TYLENOL) 500 MG tablet Take 500 mg by mouth every 6 (six) hours as needed for mild pain.   Yes Historical Provider, MD  albuterol (PROVENTIL HFA;VENTOLIN HFA) 108 (90 Base) MCG/ACT inhaler Inhale 1-2 puffs into the lungs every 6 (six) hours as needed for wheezing or shortness of breath. 07/08/15  Yes Nelva Nay, MD  escitalopram (LEXAPRO) 10 MG tablet Take 10 mg by mouth daily.   Yes Historical Provider, MD  HYDROcodone-acetaminophen (NORCO) 5-325 MG tablet Take 1 tablet by mouth every 6 (six) hours as needed for moderate pain.  01/27/16  Yes Lenda Kelp, MD  ibuprofen (ADVIL,MOTRIN) 200 MG tablet Take 200 mg by mouth every 6 (six) hours as needed for mild pain.   Yes Historical Provider, MD  meloxicam (MOBIC) 15 MG tablet Take 1 tablet (15 mg total) by mouth daily. 01/27/16  Yes Lenda Kelp, MD     Positive ROS: All other systems have been reviewed and were otherwise negative with the exception of those mentioned in the HPI and as above.  Physical Exam: General: Alert, no acute distress Cardiovascular: No pedal edema Respiratory: No cyanosis, no use of accessory musculature GI: abdomen soft Skin: No lesions in the area of chief complaint Neurologic: Sensation intact distally Psychiatric: Patient is competent for consent with normal mood and affect Lymphatic: no lymphedema  MUSCULOSKELETAL: exam stable  Assessment: left knee lateral meniscal tear  Plan: Plan for Procedure(s): LEFT KNEE ARTHROSCOPY WITH PARTIAL LATERAL MENISCECTOMY  The risks benefits and alternatives were discussed with the patient including but not limited to the risks of nonoperative treatment, versus surgical intervention including infection, bleeding, nerve injury,  blood clots, cardiopulmonary complications, morbidity, mortality, among others, and they were willing to proceed.   Glee Arvin, MD   09/02/2016 11:30 AM

## 2016-09-02 NOTE — Op Note (Signed)
   Date of Surgery: 09/02/2016  INDICATIONS: Leslie Reyes is a 26 y.o.-year-old female with a left lateral meniscus tear and synovitis who failed conservative treatment;  The patient did consent to the procedure after discussion of the risks and benefits.  PREOPERATIVE DIAGNOSIS: Left medial and lateral meniscus tear and synovitis  POSTOPERATIVE DIAGNOSIS: Same.  PROCEDURE:  1. Left knee arthroscopy with partial medial and lateral meniscectomy 2. Left knee arthroscopy with extensive synovectomy in the medial, lateral, superior patellar pouch.  SURGEON: N. Glee Arvin, M.D.  ASSIST: Patrick Jupiter, RNFA.  ANESTHESIA:  general  IV FLUIDS AND URINE: See anesthesia.  ESTIMATED BLOOD LOSS: Minimal mL.  IMPLANTS: None  DRAINS: None  COMPLICATIONS: None.  DESCRIPTION OF PROCEDURE: The patient was brought to the operating room and placed supine on the operating table.  The patient had been signed prior to the procedure and this was documented. The patient had the anesthesia placed by the anesthesiologist.  A time-out was performed to confirm that this was the correct patient, site, side and location. The patient did receive antibiotics prior to the incision and was re-dosed during the procedure as needed at indicated intervals.  A tourniquet was placed.  The patient had the operative extremity prepped and draped in the standard surgical fashion.    Standard anterolateral and anteromedial portals were established. We first visualized the medial compartment. There was significant amount of synovitis which was debrided using oscillating shaver. Once this was done we then evaluated the medial meniscus which did show a small degenerative tear of the anterior horn. This was probed and then debrided back to a stable border using oscillating shaver. We then repositioned the scopes to evaluate the femoral notch which did show significant synovitis again. This was all debrided. We then repositioned the  scope to evaluate the lateral compartment. Again there was extensive synovitis which was debrided. We then evaluated the posterior horn of the lateral meniscus which did show a horizontal cleavage tear that was unstable when probed. Partial lateral meniscectomy was then performed of the posterior horn of approximately 45% of the meniscus  Using biters and oscillating shaver. After this was then we then evaluated the patellofemoral compartment. She had early grade 1-2 changes of the femoral trochlea. The patella was healthy. She did again have extensive synovitis in the suprapatellar pouch which was debrided. Final arthroscopy pictures were taken. Incisions were closed with interrupted nylon sutures. Sterile dressings were applied. Patient tolerated procedure well had no immediate competitions.  POSTOPERATIVE PLAN: Patient will be discharged home.  Leslie Reel, MD [facility] [phone] 1:21 PM

## 2016-09-03 ENCOUNTER — Encounter (HOSPITAL_COMMUNITY): Payer: Self-pay | Admitting: Orthopaedic Surgery

## 2016-09-14 ENCOUNTER — Encounter (INDEPENDENT_AMBULATORY_CARE_PROVIDER_SITE_OTHER): Payer: Self-pay | Admitting: Orthopaedic Surgery

## 2016-09-14 ENCOUNTER — Telehealth (INDEPENDENT_AMBULATORY_CARE_PROVIDER_SITE_OTHER): Payer: Self-pay | Admitting: *Deleted

## 2016-09-14 ENCOUNTER — Ambulatory Visit (INDEPENDENT_AMBULATORY_CARE_PROVIDER_SITE_OTHER): Admitting: Orthopaedic Surgery

## 2016-09-14 DIAGNOSIS — S83282A Other tear of lateral meniscus, current injury, left knee, initial encounter: Secondary | ICD-10-CM

## 2016-09-14 NOTE — Progress Notes (Signed)
Leslie Reyes is 2 weeks status post left knee arthroscopy with partial lateral meniscectomy. She is doing okay overall. She complains of global swelling. Range of motion is progressing appropriately. She is ambulating without significant pain. I did review the arthroscopy pictures with her and showed her the tear. Again I stressed the importance of weight loss. We'll put her in physical therapy for strengthening. Follow-up in 6 weeks for recheck.

## 2016-09-14 NOTE — Telephone Encounter (Signed)
Pt came back in asking if she is out of work or light duty? CB: [phone]

## 2016-09-15 ENCOUNTER — Telehealth (INDEPENDENT_AMBULATORY_CARE_PROVIDER_SITE_OTHER): Payer: Self-pay

## 2016-09-15 NOTE — Telephone Encounter (Signed)
Desk duty for 6 weeks

## 2016-09-15 NOTE — Telephone Encounter (Signed)
Laruth Bouchard with [facility] would like patient office notes and work note faxed to [phone].  CB# is [phone].

## 2016-09-15 NOTE — Telephone Encounter (Signed)
Note ready for pick up, pt aware.

## 2016-09-15 NOTE — Telephone Encounter (Signed)
faxed

## 2016-09-15 NOTE — Telephone Encounter (Signed)
What is her work status please advise. Thank you.

## 2016-10-05 NOTE — Telephone Encounter (Signed)
Finished

## 2016-10-08 ENCOUNTER — Telehealth (INDEPENDENT_AMBULATORY_CARE_PROVIDER_SITE_OTHER): Payer: Self-pay | Admitting: Orthopaedic Surgery

## 2016-10-08 NOTE — Telephone Encounter (Signed)
PT FIELD NURSE WITH [facility] CALLED AND ASKED IF SOMEONE COULD GIVE HER A CALL BACK REGARDING HOW THE PT IS DOING, SHE STATED SHE CANT GET IN TOUCH WITH THE PT.  [phone] Leslie Reyes

## 2016-10-11 ENCOUNTER — Other Ambulatory Visit (INDEPENDENT_AMBULATORY_CARE_PROVIDER_SITE_OTHER): Payer: Self-pay

## 2016-10-11 DIAGNOSIS — S83242A Other tear of medial meniscus, current injury, left knee, initial encounter: Secondary | ICD-10-CM

## 2016-10-11 NOTE — Telephone Encounter (Signed)
Called Cindy back. PT called Cindy after SU. Patient states she was going to [facility] instead    Per Arline Aspindy Per [facility] patient has not been seen at all.   Patient will not answer Cindy's call. Concerned that she may not be doing any PT?  I advised her I will try to call patient

## 2016-10-11 NOTE — Telephone Encounter (Signed)
Tried to call patient no answered LMOM to return my call.

## 2016-10-11 NOTE — Telephone Encounter (Signed)
Fax form over and have them call her.  That's all we can do.

## 2016-10-11 NOTE — Telephone Encounter (Signed)
Tried to call again and she did not answer. What would you like for us to do?  See message below.

## 2016-10-11 NOTE — Telephone Encounter (Signed)
Leslie Reyes called back and states [facility] never called her until today. She states she has appt sched with them tomorrow and asked me to fax over PT form. PT form was faxed.

## 2016-10-25 NOTE — Addendum Note (Signed)
Addendum  created 10/25/16 1316 by Markiesha Delia, MD   Sign clinical note    

## 2016-10-25 NOTE — Anesthesia Postprocedure Evaluation (Signed)
Anesthesia Post Note  Patient: Leslie Reyes  Procedure(s) Performed: Procedure(s) (LRB): LEFT KNEE ARTHROSCOPY WITH PARTIAL LATERAL MENISCECTOMY (Left)     Anesthesia Post Evaluation  Last Vitals:  Vitals:   09/02/16 1400 09/02/16 1415  BP: 112/68 (!) 112/58  Pulse: (!) 58   Resp: (!) 22   Temp:  36.5 C    Last Pain:  Vitals:   09/02/16 1415  PainSc: Asleep                 [name]

## 2016-10-26 ENCOUNTER — Ambulatory Visit (INDEPENDENT_AMBULATORY_CARE_PROVIDER_SITE_OTHER): Payer: Self-pay | Admitting: Orthopaedic Surgery

## 2016-10-29 ENCOUNTER — Ambulatory Visit (INDEPENDENT_AMBULATORY_CARE_PROVIDER_SITE_OTHER): Payer: Self-pay | Admitting: Orthopaedic Surgery

## 2016-11-04 ENCOUNTER — Ambulatory Visit (INDEPENDENT_AMBULATORY_CARE_PROVIDER_SITE_OTHER): Payer: Self-pay | Admitting: Orthopaedic Surgery

## 2016-11-08 ENCOUNTER — Ambulatory Visit (INDEPENDENT_AMBULATORY_CARE_PROVIDER_SITE_OTHER): Payer: Self-pay | Admitting: Orthopaedic Surgery

## 2016-11-08 ENCOUNTER — Ambulatory Visit (INDEPENDENT_AMBULATORY_CARE_PROVIDER_SITE_OTHER): Admitting: Orthopaedic Surgery

## 2016-11-08 DIAGNOSIS — S83282A Other tear of lateral meniscus, current injury, left knee, initial encounter: Secondary | ICD-10-CM

## 2016-11-08 NOTE — Progress Notes (Signed)
Benetta SparVictoria is 2 months status post left knee arthroscopy with partial lateral meniscectomy. She states she is overall getting better. She [place] doing physical [place] in [place]. She still continues to have some very mild swelling of her left foot at the end of the day. She is taking Tylenol for relief. She has lost 19 pounds. On exam her surgical scars are fully healed. She has no joint effusion. She has excellent range of motion. She is ambulating pretty well. From my standpoint ready to release her back to work with restriction of no prolonged standing. TED hose was provided for pitting edema. I stressed to her the importance of being compliant with her postoperative care and follow-up appointments. She is also been noncompliant with communication with her case Production designer, theatre/television/filmmanager. I'll see her back in a month and likely release to full duty at that time. Follow-up with me in 4 weeks.

## 2016-11-09 ENCOUNTER — Telehealth (INDEPENDENT_AMBULATORY_CARE_PROVIDER_SITE_OTHER): Payer: Self-pay

## 2016-11-09 NOTE — Telephone Encounter (Signed)
Mailed the 11/08/16 office and work note to case mgr per her request. Her fax is not working at this time.

## 2016-11-16 ENCOUNTER — Telehealth (INDEPENDENT_AMBULATORY_CARE_PROVIDER_SITE_OTHER): Payer: Self-pay | Admitting: Orthopaedic Surgery

## 2016-11-16 NOTE — Telephone Encounter (Signed)
Victorino DikeJennifer (PT) with [facility] (PT) called left voicemail message asking if Plan of Care orders were received.  The number to contact Victorino DikeJennifer is [phone]  The fax# is [phone]

## 2016-11-16 NOTE — Telephone Encounter (Signed)
no

## 2016-11-16 NOTE — Telephone Encounter (Signed)
IC advised no orders seen.

## 2016-11-16 NOTE — Telephone Encounter (Signed)
Please advise 

## 2016-12-06 ENCOUNTER — Encounter (INDEPENDENT_AMBULATORY_CARE_PROVIDER_SITE_OTHER): Payer: Self-pay | Admitting: Orthopaedic Surgery

## 2016-12-06 ENCOUNTER — Telehealth (INDEPENDENT_AMBULATORY_CARE_PROVIDER_SITE_OTHER): Payer: Self-pay

## 2016-12-06 ENCOUNTER — Ambulatory Visit (INDEPENDENT_AMBULATORY_CARE_PROVIDER_SITE_OTHER): Admitting: Orthopaedic Surgery

## 2016-12-06 DIAGNOSIS — S83282A Other tear of lateral meniscus, current injury, left knee, initial encounter: Secondary | ICD-10-CM

## 2016-12-06 NOTE — Telephone Encounter (Signed)
Received vm from case mgr wanting to know if pt was released to full duty at todays appt. Called her back and left info on her vm that pt was rated and released to return to work full duty and to call me back with fax # if she needs these notes faxed to her.

## 2016-12-06 NOTE — Progress Notes (Signed)
   Office Visit Note   Patient: Leslie Reyes           Date of Birth: Apr 09, 1991           MRN: [id number] Visit Date: 12/06/2016              Requested by: No referring provider defined for this encounter. PCP: Patient, No Pcp Per   Assessment & Plan: Visit Diagnoses:  1. Acute lateral meniscus tear of left knee, initial encounter     Plan: Patient is released back to full duty at this point. Patient has reached MMI. Permanent partial impairment of 5%.  Follow-Up Instructions: Return if symptoms worsen or fail to improve.   Orders:  No orders of the defined types were placed in this encounter.  No orders of the defined types were placed in this encounter.     Procedures: No procedures performed   Clinical Data: No additional findings.   Subjective: Chief Complaint  Patient presents with  . Left Knee - Pain    Patient is a little over 3 months status post left knee scope and lateral meniscectomy. She is overall doing better. She does have some increased pain and swelling with increased activity.  She feels she is ready to go back to full duty at this point.    Review of Systems   Objective: Vital Signs: There were no vitals taken for this visit.  Physical Exam  Ortho Exam Left knee exam shows well-healed surgical scars. No joint effusion. Specialty Comments:  No specialty comments available.  Imaging: No results found.   PMFS History: Patient Active Problem List   Diagnosis Date Noted  . Acute medial meniscus tear of left knee 09/02/2016  . Synovitis of left knee 09/02/2016  . Chronic pain of left knee 07/02/2016  . Acute lateral meniscus tear of left knee 07/02/2016  . Left knee injury 01/29/2016   Past Medical History:  Diagnosis Date  . Acute lateral meniscus tear of left knee   . Anemia   . Anxiety   . Depression   . GERD (gastroesophageal reflux disease)   . Morbid obesity with BMI of 60.0-69.9, adult (HCC)   .  Pneumonia   . Pre-diabetes   . Wears glasses     Family History  Problem Relation Age of Onset  . Hypertension Mother   . Diabetes Brother   . Deep vein thrombosis Other     Past Surgical History:  Procedure Laterality Date  . KNEE ARTHROSCOPY Left 09/02/2016   Procedure: LEFT KNEE ARTHROSCOPY WITH PARTIAL LATERAL MENISCECTOMY;  Surgeon: Tarry KosNaiping M Porter Moes, MD;  Location: [facility] OR;  Service: Orthopedics;  Laterality: Left;  . TONSILLECTOMY    . wisdom teeth removal    . WISDOM TOOTH EXTRACTION     Social History   Occupational History  . Not on file.   Social History Main Topics  . Smoking status: Never Smoker  . Smokeless tobacco: Never Used  . Alcohol use No  . Drug use: No  . Sexual activity: Not Currently    Birth control/ protection: None

## 2016-12-07 ENCOUNTER — Telehealth (INDEPENDENT_AMBULATORY_CARE_PROVIDER_SITE_OTHER): Payer: Self-pay

## 2016-12-07 NOTE — Telephone Encounter (Signed)
Received call from case mgr requesting that I mail her the last office note and work note because her fax is down. Mailed to [address] [place] [id number]. Requested that I fax the work note to the employer as they are needing today. Francis DowseJohn Debore ([employer] office) [phone] and Shon HaleAngela Kyker [phone].

## 2017-11-03 ENCOUNTER — Other Ambulatory Visit: Payer: Self-pay

## 2017-11-03 ENCOUNTER — Encounter (HOSPITAL_BASED_OUTPATIENT_CLINIC_OR_DEPARTMENT_OTHER): Payer: Self-pay | Admitting: Emergency Medicine

## 2017-11-03 ENCOUNTER — Emergency Department (HOSPITAL_BASED_OUTPATIENT_CLINIC_OR_DEPARTMENT_OTHER)
Admission: EM | Admit: 2017-11-03 | Discharge: 2017-11-03 | Disposition: A | Discharge status: Home / Self Care | Payer: Self-pay | Attending: Emergency Medicine | Admitting: Emergency Medicine

## 2017-11-03 ENCOUNTER — Emergency Department (HOSPITAL_BASED_OUTPATIENT_CLINIC_OR_DEPARTMENT_OTHER): Payer: Self-pay

## 2017-11-03 DIAGNOSIS — Z79899 Other long term (current) drug therapy: Secondary | ICD-10-CM | POA: Insufficient documentation

## 2017-11-03 DIAGNOSIS — K219 Gastro-esophageal reflux disease without esophagitis: Secondary | ICD-10-CM | POA: Insufficient documentation

## 2017-11-03 LAB — CBC WITH DIFFERENTIAL/PLATELET
BASOS ABS: 0 10*3/uL (ref 0.0–0.1)
BASOS PCT: 0 %
EOS ABS: 0.2 10*3/uL (ref 0.0–0.7)
EOS PCT: 2 %
HCT: 34.2 % — ABNORMAL LOW (ref 36.0–46.0)
Hemoglobin: 11.3 g/dL — ABNORMAL LOW (ref 12.0–15.0)
Lymphocytes Relative: 37 %
Lymphs Abs: 2.9 10*3/uL (ref 0.7–4.0)
MCH: 25.6 pg — AB (ref 26.0–34.0)
MCHC: 33 g/dL (ref 30.0–36.0)
MCV: 77.4 fL — ABNORMAL LOW (ref 78.0–100.0)
MONO ABS: 0.4 10*3/uL (ref 0.1–1.0)
Monocytes Relative: 5 %
Neutro Abs: 4.3 10*3/uL (ref 1.7–7.7)
Neutrophils Relative %: 56 %
PLATELETS: 307 10*3/uL (ref 150–400)
RBC: 4.42 MIL/uL (ref 3.87–5.11)
RDW: 13.9 % (ref 11.5–15.5)
WBC: 7.8 10*3/uL (ref 4.0–10.5)

## 2017-11-03 LAB — BASIC METABOLIC PANEL
Anion gap: 6 (ref 5–15)
BUN: 13 mg/dL (ref 6–20)
CO2: 25 mmol/L (ref 22–32)
CREATININE: 0.82 mg/dL (ref 0.44–1.00)
Calcium: 8.4 mg/dL — ABNORMAL LOW (ref 8.9–10.3)
Chloride: 107 mmol/L (ref 101–111)
GFR calc Af Amer: >60 mL/min (ref >60)
GLUCOSE: 109 mg/dL — AB (ref 65–99)
Potassium: 3.6 mmol/L (ref 3.5–5.1)
SODIUM: 138 mmol/L (ref 135–145)

## 2017-11-03 LAB — PREGNANCY, URINE: PREG TEST UR: NEGATIVE

## 2017-11-03 LAB — TROPONIN I: Troponin I: <0.03 ng/mL (ref <0.03)

## 2017-11-03 MED ORDER — OMEPRAZOLE 20 MG PO CPDR: 20.0000 mg | DELAYED_RELEASE_CAPSULE | Freq: Every day | ORAL | 0 refills | Status: AC

## 2017-11-03 MED ORDER — ACETAMINOPHEN 500 MG PO TABS
1000.0000 mg | ORAL_TABLET | Freq: Once | ORAL | Status: DC
  Filled 2017-11-03: qty 2

## 2017-11-03 MED ORDER — NAPROXEN 250 MG PO TABS
500.0000 mg | ORAL_TABLET | Freq: Once | ORAL | Status: DC
  Filled 2017-11-03: qty 2

## 2017-11-03 MED ORDER — GI COCKTAIL ~~LOC~~
30.0000 mL | Freq: Once | ORAL | Status: AC
  Administered 2017-11-03: 30 mL via ORAL
  Filled 2017-11-03: qty 30

## 2017-11-03 NOTE — ED Triage Notes (Signed)
Pt c/o SOB for the past week getting worse today with 7/10 neck pain. Pt states "it feels like a swallow a quarter" pt able to walk to the room with steady gait and talking on complete sentences.

## 2017-11-03 NOTE — ED Provider Notes (Signed)
[facility] EMERGENCY DEPARTMENT Provider Note   CSN: [id number] Arrival date & time: 11/03/17  0540     History   Chief Complaint Chief Complaint  Patient presents with  . Sore Throat    HPI Leslie Reyes is a 27 y.o. female.  The history is provided by the patient.  Sore Throat  This is a new problem. The current episode started more than 1 week ago (2 weeks ago). The problem occurs constantly. The problem has not changed since onset.Pertinent negatives include no chest pain, no abdominal pain, no headaches and no shortness of breath. The symptoms are aggravated by eating. Nothing relieves the symptoms. She has tried nothing for the symptoms. The treatment provided no relief.  Feels like she "swallowed quarters".  She ate chips and it hurt in her neck and it felt like they got stuck.  No f/c/r.  No changes in voice.  No weakness.  She has not taken anything for her symptoms.    Past Medical History:  Diagnosis Date  . Acute lateral meniscus tear of left knee   . Anemia   . Anxiety   . Depression   . GERD (gastroesophageal reflux disease)   . Morbid obesity with BMI of 60.0-69.9, adult (HCC)   . Pneumonia   . Pre-diabetes   . Wears glasses     Patient Active Problem List   Diagnosis Date Noted  . Acute medial meniscus tear of left knee 09/02/2016  . Synovitis of left knee 09/02/2016  . Chronic pain of left knee 07/02/2016  . Acute lateral meniscus tear of left knee 07/02/2016  . Left knee injury 01/29/2016    Past Surgical History:  Procedure Laterality Date  . KNEE ARTHROSCOPY Left 09/02/2016   Procedure: LEFT KNEE ARTHROSCOPY WITH PARTIAL LATERAL MENISCECTOMY;  Surgeon: Tarry KosNaiping M Xu, MD;  Location: [facility] OR;  Service: Orthopedics;  Laterality: Left;  . TONSILLECTOMY    . wisdom teeth removal    . WISDOM TOOTH EXTRACTION       OB History   None      Home Medications    Prior to Admission medications   Medication Sig Start Date End  Date Taking? Authorizing Provider  acetaminophen (TYLENOL) 500 MG tablet Take 500 mg by mouth every 6 (six) hours as needed for mild pain.    [provider]  albuterol (PROVENTIL HFA;VENTOLIN HFA) 108 (90 Base) MCG/ACT inhaler Inhale 1-2 puffs into the lungs every 6 (six) hours as needed for wheezing or shortness of breath. Patient not taking: Reported on 12/06/2016 07/08/15   Nelva NayBeaton, Robert, MD  escitalopram (LEXAPRO) 10 MG tablet Take 10 mg by mouth daily.    [provider]  HYDROcodone-acetaminophen (NORCO) 5-325 MG tablet Take 1 tablet by mouth every 6 (six) hours as needed for moderate pain. Patient not taking: Reported on 12/06/2016 01/27/16   Lenda KelpHudnall, Shane R, MD  HYDROcodone-acetaminophen (NORCO) 5-325 MG tablet Take 1-2 tablets by mouth every 6 (six) hours as needed. Patient not taking: Reported on 09/14/2016 09/02/16   Tarry KosXu, Naiping M, MD  ibuprofen (ADVIL,MOTRIN) 200 MG tablet Take 200 mg by mouth every 6 (six) hours as needed for mild pain.    [provider]  meloxicam (MOBIC) 15 MG tablet Take 1 tablet (15 mg total) by mouth daily. Patient not taking: Reported on 09/14/2016 01/27/16   Lenda KelpHudnall, Shane R, MD  ondansetron (ZOFRAN) 4 MG tablet Take 1-2 tablets (4-8 mg total) by mouth every 8 (eight) hours as  needed for nausea or vomiting. Patient not taking: Reported on 09/14/2016 09/02/16   Tarry Kos, MD  promethazine (PHENERGAN) 25 MG tablet Take 1 tablet (25 mg total) by mouth every 6 (six) hours as needed for nausea. Patient not taking: Reported on 09/14/2016 09/02/16   Tarry Kos, MD  senna-docusate (SENOKOT S) 8.6-50 MG tablet Take 1 tablet by mouth at bedtime as needed. Patient not taking: Reported on 09/14/2016 09/02/16   Tarry Kos, MD    Family History Family History  Problem Relation Age of Onset  . Hypertension Mother   . Diabetes Brother   . Deep vein thrombosis Other     Social History Social History   Tobacco Use  . Smoking status: Never  Smoker  . Smokeless tobacco: Never Used  Substance Use Topics  . Alcohol use: No  . Drug use: No     Allergies   No known allergies   Review of Systems Review of Systems  Constitutional: Negative for appetite change and fever.  HENT: Positive for sore throat. Negative for drooling, facial swelling, hearing loss, mouth sores, sinus pressure, sneezing, trouble swallowing and voice change.   Respiratory: Negative for shortness of breath.   Cardiovascular: Negative for chest pain, palpitations and leg swelling.  Gastrointestinal: Negative for abdominal pain.  Genitourinary: Negative for flank pain.  Musculoskeletal: Positive for neck pain.  Neurological: Negative for headaches.  Psychiatric/Behavioral: Negative for behavioral problems.  All other systems reviewed and are negative.    Physical Exam Updated Vital Signs BP 125/71 (BP Location: Right Arm)   Pulse 71   Temp 98.7 F (37.1 C) (Oral)   Resp 19   Ht 5\' 8"  (1.727 m)   Wt (!) 181.4 kg (400 lb)   LMP 10/03/2017   SpO2 100%   BMI 60.82 kg/m   Physical Exam  Constitutional: She is oriented to person, place, and time. She appears well-developed and well-nourished. No distress.  HENT:  Head: Normocephalic and atraumatic.  Nose: Nose normal.  Mouth/Throat: Oropharynx is clear and moist. No oropharyngeal exudate.  mallempati class 2 no swelling of the lips tongue or uvula, intact phonation  Eyes: Pupils are equal, round, and reactive to light. Conjunctivae are normal.  Neck: Normal range of motion and phonation normal. Neck supple. No JVD present. No tracheal tenderness, no spinous process tenderness and no muscular tenderness present. No neck rigidity. No tracheal deviation, no edema, no erythema and normal range of motion present. No thyromegaly present.  No pain with displacement of the voice box  Cardiovascular: Normal rate, regular rhythm, normal heart sounds and intact distal pulses.  Pulmonary/Chest: Effort  normal and breath sounds normal. No stridor. No respiratory distress. She has no wheezes. She has no rales. She exhibits no tenderness.  Abdominal: Soft. Bowel sounds are normal. She exhibits no mass. There is no tenderness. There is no rebound and no guarding.  Musculoskeletal: Normal range of motion.  Lymphadenopathy:    She has no cervical adenopathy.  Neurological: She is alert and oriented to person, place, and time. She displays normal reflexes.  Skin: Skin is warm and dry. Capillary refill takes less than 2 seconds.  Psychiatric: She has a normal mood and affect.  Nursing note and vitals reviewed.    ED Treatments / Results  Labs (all labs ordered are listed, but only abnormal results are displayed) Results for orders placed or performed during the hospital encounter of 11/03/17  CBC with Differential/Platelet  Result Value Ref Range  WBC 7.8 4.0 - 10.5 K/uL   RBC 4.42 3.87 - 5.11 MIL/uL   Hemoglobin 11.3 (L) 12.0 - 15.0 g/dL   HCT 69.6 (L) 29.5 - 28.4 %   MCV 77.4 (L) 78.0 - 100.0 fL   MCH 25.6 (L) 26.0 - 34.0 pg   MCHC 33.0 30.0 - 36.0 g/dL   RDW 13.2 44.0 - 10.2 %   Platelets 307 150 - 400 K/uL   Neutrophils Relative % 56 %   Neutro Abs 4.3 1.7 - 7.7 K/uL   Lymphocytes Relative 37 %   Lymphs Abs 2.9 0.7 - 4.0 K/uL   Monocytes Relative 5 %   Monocytes Absolute 0.4 0.1 - 1.0 K/uL   Eosinophils Relative 2 %   Eosinophils Absolute 0.2 0.0 - 0.7 K/uL   Basophils Relative 0 %   Basophils Absolute 0.0 0.0 - 0.1 K/uL   WBC Morphology MORPHOLOGY UNREMARKABLE    Smear Review PLATELET COUNT CONFIRMED BY SMEAR   Basic metabolic panel  Result Value Ref Range   Sodium 138 135 - 145 mmol/L   Potassium 3.6 3.5 - 5.1 mmol/L   Chloride 107 101 - 111 mmol/L   CO2 25 22 - 32 mmol/L   Glucose, Bld 109 (H) 65 - 99 mg/dL   BUN 13 6 - 20 mg/dL   Creatinine, Ser 7.25 0.44 - 1.00 mg/dL   Calcium 8.4 (L) 8.9 - 10.3 mg/dL   GFR calc non Af Amer >60 >60 mL/min   GFR calc Af Amer >60  >60 mL/min   Anion gap 6 5 - 15  Troponin I  Result Value Ref Range   Troponin I <0.03 <0.03 ng/mL  Pregnancy, urine  Result Value Ref Range   Preg Test, Ur NEGATIVE NEGATIVE   Dg Neck Soft Tissue  Result Date: 11/03/2017 CLINICAL DATA:  Sore throat and cough EXAM: NECK SOFT TISSUES - 1+ VIEW COMPARISON:  None. FINDINGS: There is no evidence of retropharyngeal soft tissue swelling or epiglottic enlargement. The cervical airway is unremarkable and no radio-opaque foreign body identified. IMPRESSION: Negative. Electronically Signed   By: Marlan Palau M.D.   On: 11/03/2017 07:08   Dg Chest 2 View  Result Date: 11/03/2017 CLINICAL DATA:  Cough and sore throat EXAM: CHEST - 2 VIEW COMPARISON:  10/10/2015 FINDINGS: The heart size and mediastinal contours are within normal limits. Both lungs are clear. The visualized skeletal structures are unremarkable. IMPRESSION: No active cardiopulmonary disease. Electronically Signed   By: Deatra Robinson M.D.   On: 11/03/2017 06:59    EKG  EKG Interpretation  Date/Time:  Thursday November 03 2017 05:57:20 EDT Ventricular Rate:  70 PR Interval:    QRS Duration: 95 QT Interval:  391 QTC Calculation: 422 R Axis:   58 Text Interpretation:  Sinus rhythm Confirmed by Nicanor Alcon, Hindy Perrault ([id number]) on 11/03/2017 6:32:26 AM       Radiology No results found.  Procedures Procedures (including critical care time)  Medications Ordered in ED Medications  naproxen (NAPROSYN) tablet 500 mg (500 mg Oral Not Given 11/03/17 0702)  acetaminophen (TYLENOL) tablet 1,000 mg (1,000 mg Oral Not Given 11/03/17 0702)  gi cocktail (Maalox,Lidocaine,Donnatal) (30 mLs Oral Given 11/03/17 0347)     Final Clinical Impressions(s) / ED Diagnoses   Symptoms are consistent with GERD.  Unknown if patient has a web or ring.  Will refer to GI for follow up.  Take all your medication as directed.  Nothing to eat 3 hours before bed.  No greasy or  spicy food.    Return for weakness,  numbness, changes in vision or speech, fevers >100.4 unrelieved by medication, shortness of breath, intractable vomiting, or diarrhea, abdominal pain, Inability to tolerate liquids or food, cough, altered mental status or any concerns. No signs of systemic illness or infection. The patient is nontoxic-appearing on exam and vital signs are within normal limits.   I have reviewed the triage vital signs and the nursing notes. Pertinent labs &imaging results that were available during my care of the patient were reviewed by me and considered in my medical decision making (see chart for details).  After history, exam, and medical workup I feel the patient has been appropriately medically screened and is safe for discharge home. Pertinent diagnoses were discussed with the patient. Patient was given return precautions.   Federica Allport, MD 11/03/17 [phone]

## 2018-10-21 ENCOUNTER — Emergency Department
Admission: EM | Admit: 2018-10-21 | Discharge: 2018-10-21 | Disposition: A | Discharge status: Home / Self Care | Payer: 344 | Attending: Emergency Medicine | Admitting: Emergency Medicine

## 2018-10-21 ENCOUNTER — Emergency Department: Payer: 344

## 2018-10-21 DIAGNOSIS — R079 Chest pain, unspecified: Secondary | ICD-10-CM

## 2018-10-21 DIAGNOSIS — R0602 Shortness of breath: Secondary | ICD-10-CM | POA: Insufficient documentation

## 2018-10-21 DIAGNOSIS — R51 Headache: Secondary | ICD-10-CM | POA: Insufficient documentation

## 2018-10-21 DIAGNOSIS — Z09 Encounter for follow-up examination after completed treatment for conditions other than malignant neoplasm: Secondary | ICD-10-CM

## 2018-10-21 DIAGNOSIS — Z20822 Contact with and (suspected) exposure to covid-19: Secondary | ICD-10-CM

## 2018-10-21 DIAGNOSIS — Z20828 Contact with and (suspected) exposure to other viral communicable diseases: Secondary | ICD-10-CM | POA: Insufficient documentation

## 2018-10-21 DIAGNOSIS — R059 Cough, unspecified: Secondary | ICD-10-CM

## 2018-10-21 DIAGNOSIS — R05 Cough: Secondary | ICD-10-CM | POA: Insufficient documentation

## 2018-10-21 LAB — ECG 12-LEAD
Atrial Rate: 66 {beats}/min
P Axis: 29 degrees
P-R Interval: 170 ms
Q-T Interval: 396 ms
QRS Duration: 82 ms
QTC Calculation (Bezet): 415 ms
R Axis: 84 degrees
T Axis: 30 degrees
Ventricular Rate: 66 {beats}/min

## 2018-10-21 MED ORDER — ACETAMINOPHEN 500 MG PO CAPS
1000.0000 mg | ORAL_CAPSULE | Freq: Four times a day (QID) | ORAL | 0 refills | Status: AC | PRN
Start: 2018-10-21 — End: ?

## 2018-10-21 MED ORDER — GUAIFENESIN ER 600 MG PO TB12
1200.0000 mg | ORAL_TABLET | Freq: Two times a day (BID) | ORAL | 0 refills | Status: AC | PRN
Start: 2018-10-21 — End: ?

## 2018-10-21 MED ORDER — ONDANSETRON 4 MG PO TBDP
4.0000 mg | ORAL_TABLET | Freq: Four times a day (QID) | ORAL | 0 refills | Status: DC | PRN
Start: 2018-10-21 — End: 2019-03-04

## 2018-10-21 NOTE — Discharge Instructions (Signed)
Dear Donna Watson:    Thank you for choosing the [facility] Emergency Department, the premier emergency department in the [place] area.  I hope your visit today was EXCELLENT.    Specific instructions for your visit today:  Do not go to work until you are cleared by your doctor.     IF YOU DO NOT CONTINUE TO IMPROVE OR YOUR CONDITION WORSENS, PLEASE CONTACT YOUR DOCTOR OR RETURN IMMEDIATELY TO THE EMERGENCY DEPARTMENT.    Sincerely,  Nedra Hai Thereasa Parkin, MD  Attending Emergency Physician  [facility] Emergency Department    ONSITE PHARMACY  Our full service onsite pharmacy is located in the ER waiting room.  Open 7 days a week from 9 am to 9 pm.  We accept all major insurances and prices are competitive with major retailers.  Ask your provider to print your prescriptions down to the pharmacy to speed you on your way home.    OBTAINING A PRIMARY CARE APPOINTMENT    Primary care physicians (PCPs, also known as primary care doctors) are either internists or family medicine doctors. Both types of PCPs focus on health promotion, disease prevention, patient education and counseling, and treatment of acute and chronic medical conditions.    Call for an appointment with a primary care doctor.  Ask to see who is taking new patients.     [facility]  telephone:  [phone]  [email or web address]    DOCTOR REFERRALS  Call [phone] (available 24 hours a day, 7 days a week) if you need any further referrals and we can help you find a primary care doctor or specialist.  Also, available online at:  [email or web address]    YOUR CONTACT INFORMATION  Before leaving please check with registration to make sure we have an up-to-date contact number.  You can call registration at [phone] to update your information.  For questions about your hospital bill, please call [phone].  For questions about your Emergency Dept Physician bill please call [phone].      FREE HEALTH  SERVICES  If you need help with health or social services, please call 2-1-1 for a free referral to resources in your area.  2-1-1 is a free service connecting people with information on health insurance, free clinics, pregnancy, mental health, dental care, food assistance, housing, and substance abuse counseling.  Also, available online at:  http://www.[place].org    MEDICAL RECORDS AND TESTS  Certain laboratory test results do not come back the same day, for example urine cultures.   We will contact you if other important findings are noted.  Radiology films are often reviewed again to ensure accuracy.  If there is any discrepancy, we will notify you.      Please call [phone] to pick up a complimentary CD of any radiology studies performed.  If you or your doctor would like to request a copy of your medical records, please call [phone].      ORTHOPEDIC INJURY   Please know that significant injuries can exist even when an initial x-ray is read as normal or negative.  This can occur because some fractures (broken bones) are not initially visible on x-rays.  For this reason, close outpatient follow-up with your primary care doctor or bone specialist (orthopedist) is required.    MEDICATIONS AND FOLLOWUP  Please be aware that some prescription medications can cause drowsiness.  Use caution when driving or operating machinery.    The examination and treatment you have received in our Emergency Department  is provided on an emergency basis, and is not intended to be a substitute for your primary care physician.  It is important that your doctor checks you again and that you report any new or remaining problems at that time.      [place]  The nearest 24 hour pharmacy is:    [facility] [place], [place] [address]  [place] [facility]  [facility])  Call to start or finish an application, compare plans, enroll or ask a  question.  [place]: [phone]  Web:  Healthcare.gov    Help Enrolling in [place]  ([phone] (TOLL-FREE)  [phone] (TTY)  Web:  Http://www.coverva.org    Local Help Enrolling in the [place]  [phone] (MAIN)  Email:  health-help_0 .org  Web:  http://lewis-perez.info/  Address:  [address], [address] [place], [place] [address]    SEDATING MEDICATIONS  Sedating medications include strong pain medications (e.g. narcotics), muscle relaxers, benzodiazepines (used for anxiety and as muscle relaxers), Benadryl/diphenhydramine and other antihistamines for allergic reactions/itching, and other medications.  If you are unsure if you have received a sedating medication, please ask your physician or nurse.  If you received a sedating medication: DO NOT drive a car. DO NOT operate machinery. DO NOT perform jobs where you need to be alert.  DO NOT drink alcoholic beverages while taking this medicine.     If you get dizzy, sit or lie down at the first signs. Be careful going up and down stairs.  Be extra careful to prevent falls.     Never give this medicine to others.     Keep this medicine out of reach of children.     Do not take or save old medicines. Throw them away when outdated.     Keep all medicines in a cool, dry place. DO NOT keep them in your bathroom medicine cabinet or in a cabinet above the stove.    MEDICATION REFILLS  Please be aware that we cannot refill any prescriptions through the ER. If you need further treatment from what is provided at your ER visit, please follow up with your primary care doctor or your pain management specialist.    [place]  Did you know [facility] has two freestanding ERs located just a few miles away?  [place] [facility] [place] [facility] [place] have short wait times, easy free parking directly in front of the building and top patient satisfaction  scores - and the same Board Certified Emergency Medicine doctors as [facility].

## 2018-10-21 NOTE — ED Provider Notes (Signed)
[facility] EMERGENCY DEPARTMENT H&P      Visit date: 10/21/2018      CLINICAL SUMMARY           Diagnosis:    .     Final diagnoses:   Suspected Covid-19 Virus Infection   Cough   Need for follow-up care after discharge         MDM Notes:      28 year old female with symptoms suggestive of COVID-19 infection.  Chest x-ray negative.  Outpatient coronavirus test sent.  No respiratory distress, doing well on room air, no hypoxia.  Supportive care, appropriate for outpatient follow-up.  Told to return with any new or concerning problems.  Told not to report work until testing returned, cleared by her doctor.  Referral to transitional care clinic given.         Disposition:         Discharge        Discharge Prescriptions     Medication Sig Dispense Auth. Provider    guaiFENesin (MUCINEX) 600 MG 12 hr tablet Take 2 tablets (1,200 mg total) by mouth 2 (two) times daily as needed for Congestion (cough) 14 tablet Criss Alvine, MD    Acetaminophen 500 MG coapsule Take 1,000 mg by mouth every 6 (six) hours as needed for Fever or Pain 20 tablet Criss Alvine, MD    ondansetron (ZOFRAN ODT) 4 MG disintegrating tablet Take 1 tablet (4 mg total) by mouth every 6 (six) hours as needed for Nausea 20 tablet Criss Alvine, MD                         CLINICAL INFORMATION        HPI:      Chief Complaint: Chest Pain and Shortness of Breath  .    Donna Watson is a 28 y.o. female no past medical history who presents with 2 weeks of shortness of breath.  3 to 4 days ago started having cough, headache.  No fevers.  No sore throat.  No vomiting, some nausea.  no diarrhea, no abdominal pain.  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    History obtained from: patient          ROS:      Positive and negative ROS elements as per HPI.  All other systems reviewed and negative.      Physical Exam:      Pulse 65   BP 120/79   Resp 16   SpO2 100 %   Temp 98.2 F (36.8  C)    Physical Exam   Constitutional: In No acute distress. Vital signs are noted.   HEENT:        Head: Atraumatic.        Nose: No epistaxis.        Ears:       Mouth/Throat:    OP Clear.    Mucous membranes moist       Eyes: EOM intact. PERRL.  Cardiovascular:         Regular rate       Regular rhythm       No M/G/R.   Pulmonary/Chest:        No accessory muscle use       No rales       No wheezing.  Abdominal:        There are no masses  No tenderness       There is no rebound.   Musculoskeletal:        Cervical Neck: No TTP. Supple, good ROM.                   Thoracic back: No TTP       Lumbar back: No TTP       Arms: No edema or TTP.        Legs: No edema or TTP.   Neurological: Alert and oriented. Pt has normal motor function. No sensory deficits.   Skin: No rash. No pallor.   Psych: No SI/HI/Hallucinations.                PAST HISTORY        Primary Care Provider: Pcp, None, MD        PMH/PSH:    .     History reviewed. No pertinent past medical history.    She has a past surgical history that includes Knee surgery (Left).      Social/Family History:      She reports that she has never smoked. She has never used smokeless tobacco. She reports current alcohol use. She reports that she does not use drugs.    No family history on file.      Listed Medications on Arrival:    .     Home Medications     No Medications         Allergies: She has No Known Allergies.            VISIT INFORMATION        Clinical Course in the ED:      Update-  Discussed with patient isolating until cleared by Dr., especially given lab work.  Chest x-ray shows no acute findings  Feeling better on discharge, no new complaints, ambulatory O2 sat encouraging.         Medications Given in the ED:    .     ED Medication Orders (From admission, onward)    None            Procedures:      Procedures      Interpretations:      DDx/Discussion --   COVID-19, Flu, Viral, PNA, CHF  No significant increased work of breathing or  hypoxia.    *Of note, this patient was seen during 2019 COVID-19 pandemic. Clinical decision making has been affected by epidemiology, testing availability, and resource allocation issues.*      Pulse Oximetry Analysis -  Normal - SpO2 100 % on RA  Amb O2 sat = 100% on RA    Plain Film Xray Interpretation --  Study and Reading: Chest xray - No acute disease  These studies were interpreted by the ED Physician, France Ravens, MD.    EKG Interpretation  Reviewed and Interpreted by ER physician, France Ravens, MD   Rhythm: Sinus  Rate: 66  Intervals/conduction: Normal intervals  ST Segments/T wave: No specifically ischemic appearing ST elevations/depressions. Nonspecific ST/T wave changes.                RESULTS        Lab Results:      Results     Procedure Component Value Units Date/Time    [place] Ouch) [[id number]] Collected:  10/21/18 1710    Specimen:  Nasopharyngeal Swab from Nasopharynx Updated:  10/21/18 1727     SARS-CoV-2 Specimen Source Nasopharyngeal  Narrative:       Collect and clearly label specimen type:  o Preferred-Upper respiratory specimen: One Nasopharyngeal  Swab in Transport Media  OR Lower Respiratory specimen: Sputum OR Tracheal aspirate OR  BAL in sterile container  o Hand deliver to lab ASAP              Radiology Results:      XR Chest  AP Portable   Final Result       No evidence of acute disease. No significant interval change.      Stephannie Peters, MD    10/21/2018 5:38 PM                  Scribe Attestation:      No scribe involved in the care of this patient          Criss Alvine, MD  10/21/18 1850

## 2018-10-21 NOTE — ED Triage Notes (Signed)
Pt a/ox4, ambulatory to ED reports SOB for 2 weeks. States she needs to sit straight in order to breathe. Also reports cough with some mucus onset 3 days ago. Reports chills but no fevers.  Pt talking in full sentences. Also reports CP "that hurts when I cough, when my chest expands, and  makes my head hurt"  that started last night.

## 2018-10-24 LAB — CORONAVIRUS, COVID-19: SARS CoV 2 Overall Result: NOT DETECTED

## 2018-10-25 ENCOUNTER — Telehealth (INDEPENDENT_AMBULATORY_CARE_PROVIDER_SITE_OTHER): Payer: Self-pay

## 2018-10-25 NOTE — Telephone Encounter (Signed)
COVID-19 Test Results Notification Team    Placed call to patient in an effort to provide COVID-19 NEGATIVE test result.  Attempted to reach patient at the following numbers: [phone]; additional line [phone] was [employer]     Left the following voicemail on [phone] for patient:    My name is Britta Mccreedy and I am calling from [facility] to provide you with results from your recent test done at [facility]  Please call me back at [phone] Sunday - Thursday between the hours of 8:30 AM - 5 PM or  the COVID Results Call Center at [phone] so that we can provide you with the results of your test.  The Call Center is open 7 days a week, between the hours of 8:00am--8:00pm.  In addition, you can view your results by signing up for MyChart at [email or web address]  Thank you.    Lovena Neighbours, RN  COVID-19 Results Notification Team  Direct Line: [phone]  COVID-19 Test Results Call Center: [phone]

## 2018-10-26 ENCOUNTER — Telehealth (INDEPENDENT_AMBULATORY_CARE_PROVIDER_SITE_OTHER): Payer: Self-pay

## 2018-10-26 NOTE — Telephone Encounter (Signed)
COVID-19 Test Results Notification Team    Spoke with patient to provide NEGATIVE COVID-19 test result.      After verifying patient name, address and DOB, patient was provided with the negative result.    Patient's questions were answered and patient was provided with the COVID-19 Results Call Center number in the event that patient has any additional questions or concerns.     Patient has no insurance and is denying a referral to a clinic.    Patient was advised to follow CDC guidance on social distancing and hand washing.    Patient denies any current symptoms.  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 Offered to mail or email patients her results or have her verify her results in MyChart. Pt declined and stated "I will have someone call all of you".    Advised patient to contact their Urgent Care  in the event that patient's symptoms change or worsen.      Reminded patient that the emergency room is always available in the event of an emergency.      Lovena Neighbours, RN  COVID-19 Results Notification Team  Direct Line: [phone]  COVID-19 Test Results Call Center: [phone]

## 2018-12-13 ENCOUNTER — Emergency Department
Admission: EM | Admit: 2018-12-13 | Discharge: 2018-12-13 | Disposition: A | Discharge status: Home / Self Care | Payer: Medicaid Other | Attending: Emergency Medicine | Admitting: Emergency Medicine

## 2018-12-13 ENCOUNTER — Emergency Department: Payer: Medicaid Other

## 2018-12-13 DIAGNOSIS — S0990XA Unspecified injury of head, initial encounter: Secondary | ICD-10-CM | POA: Insufficient documentation

## 2018-12-13 DIAGNOSIS — S86812A Strain of other muscle(s) and tendon(s) at lower leg level, left leg, initial encounter: Secondary | ICD-10-CM | POA: Insufficient documentation

## 2018-12-13 DIAGNOSIS — S99921A Unspecified injury of right foot, initial encounter: Secondary | ICD-10-CM | POA: Insufficient documentation

## 2018-12-13 DIAGNOSIS — S46911A Strain of unspecified muscle, fascia and tendon at shoulder and upper arm level, right arm, initial encounter: Secondary | ICD-10-CM | POA: Insufficient documentation

## 2018-12-13 DIAGNOSIS — S86912A Strain of unspecified muscle(s) and tendon(s) at lower leg level, left leg, initial encounter: Secondary | ICD-10-CM

## 2018-12-13 MED ORDER — IBUPROFEN 400 MG PO TABS
400.00 mg | ORAL_TABLET | Freq: Four times a day (QID) | ORAL | 0 refills | Status: AC | PRN
Start: 2018-12-13 — End: ?

## 2018-12-13 MED ORDER — IBUPROFEN 600 MG PO TABS
600.0000 mg | ORAL_TABLET | Freq: Once | ORAL | Status: AC
Start: 2018-12-13 — End: 2018-12-13
  Administered 2018-12-13: 04:00:00 600 mg via ORAL
  Filled 2018-12-13: qty 1
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
## 2018-12-13 NOTE — Discharge Instructions (Signed)
#######################################    Return immediately to the emergency room if you have any worsening symptoms!!! Otherwise, see general doctor in the next 2 days, or return to the emergency department in 2 days if you have any trouble getting an appointment.       #######################################          Thank you for choosing [facility] for your emergency care needs.  We strive to provide EXCELLENT care to you and your family.      If you do not continue to improve or your condition worsens, please contact your doctor or return immediately to the Emergency Department.    The examination and treatment you have received in our Emergency Department is provided on an emergency basis, and is not intended to be a substitute for your primary care physician.  It is important that your doctor checks you again and that you report any new or remaining problems at that time.      DOCTOR REFERRALS  Call [phone] (available 24 hours a day, 7 days a week) if you need any further referrals and we can help you find a primary care doctor or specialist.  Also, available online at:  [email or web address]    YOUR CONTACT INFORMATION  Before leaving please check with registration to make sure we have an up-to-date contact number.  You can call registration at [phone] to update your information.  For questions about your hospital bill, please call [phone].  For questions about your Emergency Dept Physician bill please call [phone].      FREE HEALTH SERVICES  If you need help with health or social services, please call 2-1-1 for a free referral to resources in your area.  2-1-1 is a free service connecting people with information on health insurance, free clinics, pregnancy, mental health, dental care, food assistance, housing, and substance abuse counseling.  Also, available online at:  http://www.[facility].org    MEDICAL RECORDS AND TESTS  Certain laboratory  test results do not come back the same day, for example urine cultures.   We will contact you if other important findings are noted.  Radiology films are often reviewed again to ensure accuracy.  If there is any discrepancy, we will notify you.      Please call [phone] to pick up a complimentary CD of any radiology studies performed.  If you or your doctor would like to request a copy of your medical records, please call [phone].      ORTHOPEDIC INJURY   Please know that significant injuries can exist even when an initial x-ray is read as normal or negative.  This can occur because some fractures (broken bones) are not initially visible on x-rays.  For this reason, close outpatient follow-up with your primary care doctor or bone specialist (orthopedist) is required.    MEDICATIONS AND FOLLOWUP  Please be aware that some prescription medications can cause drowsiness.  Use caution when driving or operating machinery.    24 HOUR PHARMACIES  [facility] [address], [place], [place] [address] (1.4 miles, 7 minutes)  [facility] - [address], [place], [place] [address] (6.5 miles, 13 minutes)  A handout with directions is available on request.    PATIENT RELATIONS  If you have any concerns, issues or feedback with your care, positive or negative, please do not hesitate to contact Patient Relations at [phone]. They are open from 8:30AM-5:00PM Monday through Friday.    Head Injury, NOS  You have been seen for a head injury.    A head injury can happen after something strikes the head or as a result of a fall or other injury. Head injuries can range from mild injuries to more severe injuries. The more severe injuries can result in broken bones or injury to the brain itself. Mild head injuries will show no abnormalities if a CT (CAT) scan of the brain is done.     Although you had an injury to your head, you do not seem to have a serious brain injury.     Head injury symptoms can last from hours to months.  The time depends on how bad the injury was. It also depends on whether you've had a concussion in the past. Some problems with a concussion can include: Sleep, memory and concentration problems. They also include chronic (ongoing) headaches and sensitivity to light. These symptoms can happen soon after the concussion. They can also develop slowly over time. They can last up to a year. When this happens, it is called "post concussion syndrome."    If you develop "post-concussive syndrome," you should follow up with your doctor. Your doctor can care for you or provide a referral to a head-injury specialist.    Treatment includes observation at home and pain medicine like acetaminophen (Tylenol) or ibuprofen (Advil or Motrin). Prescription pain medicine is probably not needed.    You might have a mild headache for a few days.    Over the next 24 hours:   Stay with family or friends who can watch your behavior.   Avoid alcohol or drugs.    YOU SHOULD SEEK MEDICAL ATTENTION IMMEDIATELY, EITHER HERE OR AT THE NEAREST EMERGENCY DEPARTMENT, IF ANY OF THE FOLLOWING OCCURS:   Your headache gets worse.   Your headache pain changes.   You have fever (temperature higher than 100.3F / 38C), neck pain, vision changes, difficulty walking or change of behavior.   You feel numbness, tingling, weakness in your arms or legs.   You faint.   Your vision changes.   You vomit often or cannot keep medicine down.   You are confused or have difficulty waking from sleep.               Shoulder Strain    You have been diagnosed with a shoulder strain.    The shoulder joint is surrounded by several muscles. A strain happens when a muscle is stretched or partly torn. This usually happens from using the muscle too much or doing an activity the muscle is not used to. Strains should not be confused with sprains. Sprains are injuries to the ligaments. Ligaments hold bones together.    An injury to the shoulder can be especially  painful. This is because when you move your arm, you also have to move your shoulder.    Treatment often includes resting the shoulder and using pain medicines. A sling is sometimes used. If your doctor places you in a sling, it is important to take your arm out of the sling every 2 or 3 hours. Move it around. This way, your shoulder will not "freeze." If you get a "frozen shoulder," problems like more pain or severe (serious) stiffness may develop.    Treatment also includes using ice on the painful area. Putting ice on the affected area can reduce swelling and pain. Put some ice cubes in a re-sealable (Ziploc) bag and add some water. Put a thin washcloth between the bag  and the skin. Apply the ice bag to the area for at least 20 minutes. Do this at least 4 times per day. It is okay to use the ice more often and to apply it longer. NEVER APPLY ICE DIRECTLY TO THE SKIN.    Try to keep the injured shoulder elevated (lifted). You can sit up in a chair or recliner and sleep on an extra pillow in bed at night.    Take the medicine your doctor has prescribed. These medicines often help with pain and inflammation.    YOU SHOULD SEEK MEDICAL ATTENTION IMMEDIATELY, EITHER HERE OR AT THE NEAREST EMERGENCY DEPARTMENT, IF ANY OF THE FOLLOWING OCCURS:   Numbness or tingling in your arm or hand.   A cool, pale hand.   Severe (serious) neck or shoulder pain that acetaminophen (Tylenol), ibuprofen (Advil or Motrin) or the medicine prescribed by the doctor does not help.     Toe Sprain    You have been seen for a toe sprain.    A sprain is an injury to a ligament, often a rip or partial rip. Sprints are often as painful as a fracture (break). Sprains can be put into 3 categories. The categories are based on the amount of injury to the ligaments. A first-degree sprain is a minor tear. With a third-degree sprain, the bone that the ligament is attached to often has a chip fracture. Athletic activities that put repeated  pressure on the toe joint can lead to a toe sprain. Other causes are sudden movements like stubbing your toe or landing on your toe the wrong way when jumping.    Some toe sprain symptoms are pain, swelling, bruising, and limited movement as the ligament tear is healing. This usually takes a few weeks, depending on how bad the tear is. The pain is the worst for the first 24-48 hours after the injury.    General care for sprains includes using medication for the pain, using a splint to keep you from moving and Resting, Icing, Compressing, and Elevating the injured area. Remember this as "RICE."     REST: Limit the use of the injured body part.     ICE: By applying ice to the affected area, swelling and pain can be reduced. Place some ice cubes in a re-sealable (Ziploc) bag and add some water. Put a thin washcloth between the bag and the skin. Apply the ice bag to the area for at least 20 minutes. Do this at least 4 times per day. Using the ice for longer times and more frequently is OK. NEVER APPLY ICE DIRECTLY TO THE SKIN.     COMPRESS: Compression means to apply pressure around the injured area such as with a splint, cast or an ace bandage. Compression decreases swelling and improves comfort. Compression should be tight enough to relieve swelling but not so tight as to decrease circulation. Increasing pain, numbness, tingling, or change in skin color, are all signs of decreased circulation.     ELEVATE: Elevate the injured part. For example, a leg can be elevated by placing the leg on a chair while sitting and propped up on pillows while lying down.    Use medicine like acetaminophen (Tylenol) or ibuprofen (Advil, Motrin) for pain. Follow the directions on the package. Most pharmacies have it.    Wear a shoe with a hard sole for a while for comfort. The hard shoe will limit the amount of bending at the fracture site. You can buy a "  post operative or "cast" shoe at a medical supply store.    YOU  SHOULD SEEK MEDICAL ATTENTION IMMEDIATELY, EITHER HERE OR AT THE NEAREST EMERGENCY DEPARTMENT, IF ANY OF THE FOLLOWING OCCUR:     There is a serious increase in pain in the affected area.   You get new numbness and tingling in or under the affected area.   Your symptoms haven't started to get better in 1-2 weeks.   The toe is noticeably misshapen after the swelling gets better, especially if you didn't get x-rays during your visit.    If you can't follow up with your doctor, or if at any time you feel you need to be rechecked or seen again, come back here or go to the nearest emergency department.             Knee Injury, General    You have been seen for a knee injury.    There are a few different kinds of injuries that can happen with the knee. They can be seen separately (individually) or can happen together.    Ligament Injuries:    A sprain is an injury to a ligament (a type of connective tissue). It is usually a tear or partial tear. Sprains can be as painful as broken bones.   There are different degrees of injury. A first-degree sprain is a minor tear. With a third-degree sprain, there is often a chip in the bone torn away where the ligament attaches.    It can be hard to tell which ligament is injured. This is because of the swelling and pain that happen with some knee injuries.   Sprains are treated with medicine to lower pain and splints to limit movement. They are also treated with Rest, Ice, Compressing and Elevating the injured area (RICE).    Cartilage Injuries:   Cartilage is the soft, flexible tissue in the body. The knee has a lot of cartilage. It can be damaged from injury or over-use.    Once the cartilage is damaged, surgery may be needed. This is done to take out the injured pieces. A bone doctor decides if a knee injury needs surgery. This doctor is called an orthopedist.   An injury damaging the knee's cartilage often also damages the soft tissues (ligaments) of the knee.      Treatment for a knee injury includes:   REST: Limit the use of the injured body part.     ICE: By applying ice to the affected area, swelling and pain can be reduced. Place some ice cubes in a re-sealable (Ziploc) bag and add some water. Put a thin washcloth between the bag and the skin. Apply the ice bag to the area for at least 20 minutes. Do this at least 4 times per day. It is okay to do this more often than directed. You can also do it for longer than directed. NEVER APPLY ICE DIRECTLY TO THE SKIN.     COMPRESS: Compression means to apply pressure around the injured area such as with a splint, cast or an ACE bandage. Compression lowers swelling and makes the area more comfortable. Compression should be tight enough to relieve swelling but not so tight it lowers circulation. Increasing pain, numbness, tingling, or change in skin color are all signs of decreased circulation.      ELEVATE: Elevate the injured part. You can prop an injured leg on a chair while sitting, or on pillows while lying down.    You  may take off the splint when you sleep and bathe. You can also take it off during the day when you are not walking. Bend your knee a few times to help with stiffness when you remove the splint.    YOU SHOULD SEEK MEDICAL ATTENTION IMMEDIATELY, EITHER HERE OR AT THE NEAREST EMERGENCY DEPARTMENT, IF ANY OF THE FOLLOWING OCCUR:     You have a severe increase in pain.   You have new numbness or tingling in or below the affected area.   Your leg or foot is cold or pale. This could mean it is having problems with its blood supply.   You have a fever (temperature higher than 100.63F or 38C) or chills.   Your knee gets more red or feels warm.    If you can't follow up with your doctor, or if at any time you feel you need to be rechecked or seen again, come back here or go to the nearest emergency department.

## 2019-01-05 ENCOUNTER — Emergency Department: Payer: Medicaid Other

## 2019-01-05 ENCOUNTER — Emergency Department
Admission: EM | Admit: 2019-01-05 | Discharge: 2019-01-06 | Disposition: A | Discharge status: Home / Self Care | Payer: Medicaid Other | Attending: Student in an Organized Health Care Education/Training Program | Admitting: Student in an Organized Health Care Education/Training Program

## 2019-01-05 DIAGNOSIS — Z3A01 Less than 8 weeks gestation of pregnancy: Secondary | ICD-10-CM

## 2019-01-05 DIAGNOSIS — O200 Threatened abortion: Secondary | ICD-10-CM | POA: Insufficient documentation

## 2019-01-05 DIAGNOSIS — Z3A08 8 weeks gestation of pregnancy: Secondary | ICD-10-CM | POA: Insufficient documentation

## 2019-01-05 DIAGNOSIS — R1012 Left upper quadrant pain: Secondary | ICD-10-CM | POA: Insufficient documentation

## 2019-01-05 DIAGNOSIS — O9989 Other specified diseases and conditions complicating pregnancy, childbirth and the puerperium: Secondary | ICD-10-CM | POA: Insufficient documentation

## 2019-01-05 LAB — URINALYSIS, REFLEX TO MICROSCOPIC EXAM IF INDICATED
Bilirubin, UA: NEGATIVE
Blood, UA: NEGATIVE
Glucose, UA: NEGATIVE
Ketones UA: NEGATIVE
Nitrite, UA: NEGATIVE
Protein, UR: NEGATIVE
Specific Gravity UA: 1.027 (ref 1.001–1.035)
Urine pH: 5 (ref 5.0–8.0)
Urobilinogen, UA: NORMAL mg/dL (ref 0.2–2.0)

## 2019-01-05 LAB — CBC AND DIFFERENTIAL
Absolute NRBC: 0 10*3/uL (ref 0.00–0.00)
Basophils Absolute Automated: 0.03 10*3/uL (ref 0.00–0.08)
Basophils Automated: 0.4 %
Eosinophils Absolute Automated: 0.2 10*3/uL (ref 0.00–0.44)
Eosinophils Automated: 2.5 %
Hematocrit: 32.7 % — ABNORMAL LOW (ref 34.7–43.7)
Hgb: 10.2 g/dL — ABNORMAL LOW (ref 11.4–14.8)
Immature Granulocytes Absolute: 0.03 10*3/uL (ref 0.00–0.07)
Immature Granulocytes: 0.4 %
Lymphocytes Absolute Automated: 2.41 10*3/uL (ref 0.42–3.22)
Lymphocytes Automated: 30.7 %
MCH: 25.3 pg (ref 25.1–33.5)
MCHC: 31.2 g/dL — ABNORMAL LOW (ref 31.5–35.8)
MCV: 81.1 fL (ref 78.0–96.0)
MPV: 10 fL (ref 8.9–12.5)
Monocytes Absolute Automated: 0.51 10*3/uL (ref 0.21–0.85)
Monocytes: 6.5 %
Neutrophils Absolute: 4.67 10*3/uL (ref 1.10–6.33)
Neutrophils: 59.5 %
Nucleated RBC: 0 /100 WBC (ref 0.0–0.0)
Platelets: 271 10*3/uL (ref 142–346)
RBC: 4.03 10*6/uL (ref 3.90–5.10)
RDW: 15 % (ref 11–15)
WBC: 7.85 10*3/uL (ref 3.10–9.50)

## 2019-01-05 LAB — BASIC METABOLIC PANEL
Anion Gap: 4 — ABNORMAL LOW (ref 5.0–15.0)
BUN: 11 mg/dL (ref 7–19)
CO2: 26 mEq/L (ref 22–29)
Calcium: 8.5 mg/dL (ref 8.5–10.5)
Chloride: 109 mEq/L (ref 100–111)
Creatinine: 0.7 mg/dL (ref 0.6–1.0)
Glucose: 96 mg/dL (ref 70–100)
Potassium: 4.1 mEq/L (ref 3.5–5.1)
Sodium: 139 mEq/L (ref 136–145)

## 2019-01-05 LAB — HCG QUANTITATIVE: hCG, Quant.: 221.3

## 2019-01-05 LAB — GFR: EGFR: >60

## 2019-01-05 MED ORDER — SODIUM CHLORIDE 0.9 % IV BOLUS
1000.00 mL | Freq: Once | INTRAVENOUS | Status: DC
Start: 2019-01-05 — End: 2019-01-06

## 2019-01-05 MED ORDER — SODIUM CHLORIDE 0.9 % IV BOLUS
1000.00 mL | Freq: Once | INTRAVENOUS | Status: AC
Start: 2019-01-05 — End: 2019-01-06
  Administered 2019-01-05: 22:00:00 1000 mL via INTRAVENOUS

## 2019-01-05 MED ORDER — CEFTRIAXONE SODIUM 1 G IJ SOLR
1.00 g | Freq: Once | INTRAMUSCULAR | Status: AC
Start: 2019-01-05 — End: 2019-01-06
  Administered 2019-01-05: 22:00:00 1 g via INTRAVENOUS
  Filled 2019-01-05: qty 1000

## 2019-01-05 MED ORDER — ACETAMINOPHEN 325 MG PO TABS
650.0000 mg | ORAL_TABLET | Freq: Once | ORAL | Status: AC
Start: 2019-01-05 — End: 2019-01-05
  Administered 2019-01-05: 22:00:00 650 mg via ORAL
  Filled 2019-01-05: qty 2

## 2019-01-05 NOTE — ED Triage Notes (Signed)
Pt c/o LLQ abdominal pain x 3 days. Pain described as cramping. Pt admit to nausea, and sore breast. Denies vomiting, diarrhea, urinary symptoms or constipation. Pt concerned she maybe pregnant. Pt reports she took home pregnancy test this morning results positive.

## 2019-01-05 NOTE — Discharge Instructions (Signed)
Dear Donna Watson:    You were evaluated in the Emergency Department for abdominal pain.  Your labs show no acute abnormalities.  Your pregnancy test was positive.  Your hormone level is very low and needs to be followed.  Please follow up with Ob/gyn in 2-3 days.    The examination and treatment you have received today has been on an emergency basis only and has not been intended to substitute for complete medical care.  Since it is impossible to recognize and treat all elements of your injury or illness in a single visit, for your protection in preventing possible complications, if you are not clearly improving in 1-2 days, you should be seen for a re-check at the ER or with your primary care physician.        Instructions:    Continue home medications as prescribed.    Return to the ER for fevers, severe pain, intractable vomiting, chest pain, shortness of breath, worsening of conditions or any concerns.    Follow up with ob/gyn in 2-3 days.    Thank you for choosing [facility] for your medical care.    Sincerely,  Rosamaria Lints, MD  [facility]    ________________________________________________________________    Thank you for choosing [facility] for your emergency care needs.  We strive to provide EXCELLENT care to you and your family.      IF YOU DO NOT CONTINUE TO IMPROVE OR YOUR CONDITION WORSENS, PLEASE CONTACT YOUR DOCTOR OR RETURN IMMEDIATELY TO THE EMERGENCY DEPARTMENT.    ONSITE PHARMACY  Our full service onsite pharmacy is a 2 minute walk from the ER.  Open Mon to Fri from 8 am to 8 pm, Sat  9 am to 5 pm. Ask an ED staff member for directions.  We accept all major insurances and prices are competitive with major retailers.  Ask your provider to print your prescriptions down to the pharmacy to speed you on your way home.    OBTAINING A PRIMARY CARE APPOINTMENT    Primary care physicians (PCPs, also known as primary care doctors) are  either internists or family medicine doctors. Both types of PCPs focus on health promotion, disease prevention, patient education and counseling, and treatment of acute and chronic medical conditions.    Call for an appointment with a primary care doctor.  Ask to see who is taking new patients. Two options are below.    Both groups have offices near [place] and in the [place] area.    [facility]  telephone:  [phone]  [email or web address]    [facility]  telephone:  [phone]  [email or web address]      DOCTOR REFERRALS  Call [phone] (available 24 hours a day, 7 days a week) if you need any further referrals and we can help you find a primary care doctor or specialist.  Also, available online at:  [email or web address]      YOUR CONTACT INFORMATION  Before leaving please check with registration to make sure we have an up-to-date contact number.  You can call registration at [phone] to update your information.  For questions about your hospital bill, please call [phone].  For questions about your Emergency Dept Physician bill please call [phone].      FREE HEALTH SERVICES  If you need help with health or social services, please call 2-1-1 for a free referral to resources in your area.  2-1-1 is a free service connecting people with information on health insurance, free clinics, pregnancy, mental health, dental care, food assistance, housing, and substance abuse counseling.  Also, available online at:  http://www.[facility].org    MEDICAL RECORDS AND TESTS  Certain laboratory test results do not come back the same day, for example urine cultures.   We will contact you if other important findings are noted.  Radiology films are often reviewed again to ensure accuracy.  If there is any discrepancy, we will notify you.      Please call [phone] to pick up a complimentary CD of any radiology studies performed.  If you or  your doctor would like to request a copy of your medical records, please call [phone].      ORTHOPEDIC INJURY   Please know that significant injuries can exist even when an initial x-ray is read as normal or negative.  This can occur because some fractures (broken bones) are not initially visible on x-rays.  For this reason, close outpatient follow-up with your primary care doctor or bone specialist (orthopedist) is required.    MEDICATIONS AND FOLLOWUP  Please be aware that some prescription medications can cause drowsiness.  Use caution when driving or operating machinery.    The examination and treatment you have received in our Emergency Department is provided on an emergency basis, and is not intended to be a substitute for your primary care physician.  It is important that your doctor checks you again and that you report any new or remaining problems at that time.      24 HOUR PHARMACIES  [facility] [address], [place], [place] [address] (1.4 miles, 7 minutes)  [facility] - [address], [place], [place] [address] (6.5 miles, 13 minutes)  Handout with directions available on request        ASSISTANCE WITH INSURANCE    Affordable Care Act  [facility])  Call to start or finish an application, compare plans, enroll or ask a question.  [phone]  TTY: [phone]  Web:  Healthcare.gov    Help Enrolling in [facility]  [employer] [place]  720-868-1456 (TOLL-FREE)  873-262-9133 (TTY)  Web:  Http://www.coverva.org    Local Help Enrolling in the [facility]  [place] [facility]  [phone] (MAIN)  Email:  health-help@nvfs .org  Web:  [email or web address]  Address:  [address], [address] [place], [place] [address]        Abdominal Pain    You have been diagnosed with abdominal (belly) pain. The cause of your pain is not yet known.    Many things can cause abdominal pain such as infections and bowel (intestine) spasms. You might need another examination or more tests to find out why you have  pain.    At this time, your pain does not seem to be caused by anything dangerous. You do not need surgery. You do not need to stay in the hospital.     Though we dont believe your condition is dangerous right now, it is important to be careful. Sometimes a problem that seems mild now can become serious later. If you do not get completely better or your symptoms get worse, you should seek more care. This is why it is important that you get additional help unless you are 100% improved    Follow up with your regular doctor in:   24 hours.    For the next 24 hours, Drink only clear liquids such as:   Water.  Clear broth.   Sports drinks.   Clear caffeine-free soft drinks, like 7-Up or Sprite.    Return here or go to the nearest Emergency Department immediately if:   Your pain does not go away or gets worse.   You cannot keep fluids down    Your vomit (throw up) is dark green.    You vomit (throw up) blood or see blood in your stool (poop). Blood might be bright red or dark red. It can also be black and look like tar.   You have a fever (temperature higher than 100.65F / 38C) or shaking chills.   Your skin or eyes look yellow.   Your urine looks [place] Gossen.   You have severe diarrhea.    If you can't follow up with your doctor, or if at any time you feel you need to be rechecked or seen again, come back here or go to the nearest emergency department.

## 2019-01-05 NOTE — ED Notes (Signed)
DSW or Walking Ability Test    DSW Is the patient able to perform the following function independently without weakness or dizziness?   D (dangle): Ask patient to sit on the edge of the bed Yes   S (stand): Ask patient to stand up and observe for any fatigue or swaying Yes   W (walk): Ask patient to lift their feet completely off the floor 3-4 times Yes     Patient ambulated to restroom with steady gait.

## 2019-01-05 NOTE — ED Provider Notes (Signed)
[facility]    EMERGENCY DEPARTMENT HISTORY AND PHYSICAL EXAM    Patient Name: Donna Watson, Donna Watson  Encounter Date:  01/05/2019  Patient DOB:  04/18/1991  MRN:  [id number]  Room:  15/B15    History of Presenting Illness     28 y.o. female previously healthy, G0 co intermittent abdominal cramping LUQ/RLQ pain x 2-3 days that acutely increased today.  Pt reports taking a pregnancy test today that was positive.  LMP ~1 month ago.  Pt reports nausea.  Denies vomiting, vaginal discharge, vaginal bleeding, fevers, CP, SOB, dysuria, hematuria.  Denies taking analgesics.  No hx of similar sx.     Past Medical History     History reviewed. No pertinent past medical history.    Past Surgical History     Past Surgical History:   Procedure Laterality Date    KNEE SURGERY Left     KNEE SURGERY Left        Family History     History reviewed. No pertinent family history.    Social History     Social History     Socioeconomic History    Marital status: Single     Spouse name: Not on file    Number of children: Not on file    Years of education: Not on file    Highest education level: Not on file   Occupational History    Not on file   Social Needs    Financial resource strain: Not on file    Food insecurity     Worry: Not on file     Inability: Not on file    Transportation needs     Medical: Not on file     Non-medical: Not on file   Tobacco Use    Smoking status: Never Smoker    Smokeless tobacco: Never Used   Substance and Sexual Activity    Alcohol use: Not Currently     Comment: sometimes     Drug use: Never    Sexual activity: Not on file   Lifestyle    Physical activity     Days per week: Not on file     Minutes per session: Not on file    Stress: Not on file   Relationships    Social connections     Talks on phone: Not on file     Gets together: Not on file     Attends religious service: Not on file     Active member of club or organization: Not on file     Attends meetings of  clubs or organizations: Not on file     Relationship status: Not on file    Intimate partner violence     Fear of current or ex partner: Not on file     Emotionally abused: Not on file     Physically abused: Not on file     Forced sexual activity: Not on file   Other Topics Concern    Not on file   Social History Narrative    Not on file       Review of Systems     Review of Systems   Constitutional: Negative for fever and chills.   HENT: Negative for sore throat.    Respiratory: Negative for cough and shortness of breath.    Cardiovascular: Negative for chest pain.   Gastrointestinal: Positive for nausea and abdominal pain and Negative for vomiting and diarrhea.   Genitourinary: Negative  for dysuria.   Neurological: Negative for headaches.  Skin: Negative for rash   All other systems reviewed and are negative.    Physical Exam   BP 115/58    Pulse 60    Temp 98.2 F (36.8 C) (Oral)    Resp 22    Wt 156.2 kg    LMP 12/03/2018 (Within Days)    SpO2 100%    BMI 52.36 kg/m   Physical Exam   Constitutional: Patient is oriented to person, place, and time. Patient appears well-developed and well-nourished. No distress.   HENT:   Head: Normocephalic and atraumatic.   Right Ear: External ear normal.   Left Ear: External ear normal.   Nose: Nose normal.   Mouth/Throat: Oropharynx is clear and moist.   Eyes: Conjunctivae and EOM are normal.   Neck: Neck supple. No tracheal deviation present.   Cardiovascular: Normal rate, regular rhythm and normal heart sounds.  Exam reveals no gallop and no friction rub.  No murmur heard.  Pulmonary/Chest: No respiratory distress. Patient has no wheezes. Patient has no rales.   Abdominal: Soft. Patient exhibits no distension. Mild LUQ tenderness to palp.  Negative mcburneys pt ttp.  Negative murphys. There is no rebound and no guarding.   Musculoskeletal: Normal range of motion. Patient exhibits no edema.   Neurological: Patient is alert and oriented to person, place, and time. No gross  cranial nerve deficit.   Skin: Skin is warm. No rash noted.   Psychiatric: Patient has a normal mood and affect. Patient's behavior is normal.   Nursing note and vitals reviewed.    ED Medications Administered     ED Medication Orders (From admission, onward)    Start Ordered     Status Ordering Provider    01/05/19 2155 01/05/19 2154  cefTRIAXone (ROCEPHIN) 1 g in sodium chloride 0.9 % 100 mL IVPB mini-bag plus  Once     Route: Intravenous  Ordered Dose: 1 g     Last MAR action:  Stopped Rosamaria Lints    01/05/19 2155 01/05/19 2154  sodium chloride 0.9 % bolus 1,000 mL  Once     Route: Intravenous  Ordered Dose: 1,000 mL     Acknowledged Rosamaria Lints    01/05/19 2155 01/05/19 2154  acetaminophen (TYLENOL) tablet 650 mg  Once     Route: Oral  Ordered Dose: 650 mg     Last MAR action:  Given Rosamaria Lints    01/05/19 2055 01/05/19 2054  sodium chloride 0.9 % bolus 1,000 mL  Once     Route: Intravenous  Ordered Dose: 1,000 mL     Last MAR action:  Stopped Katriana Dortch J          Orders Placed During This Encounter     Orders Placed This Encounter   Procedures    US OB < 14 Weeks with Transvag    Basic Metabolic Panel    CBC and differential    UA, Reflex to Microscopic (pts 3 + yrs)    Beta HCG, Quant, Serum    GFR    Diet NPO effective now    Saline lock IV       Diagnostic Study Results     The results of the diagnostic studies below were reviewed by the ED provider:    Labs  Results     Procedure Component Value Units Date/Time    Beta HCG, Sharene Butters, Serum [[id number]] Collected:  01/05/19 2102  Updated:  01/05/19 2137     hCG, Sharene Butters. 221.3    Basic Metabolic Panel [[id number]]  (Abnormal) Collected:  01/05/19 2102    Specimen:  Blood Updated:  01/05/19 2129     Glucose 96 mg/dL      BUN 11 mg/dL      Creatinine 0.7 mg/dL      Calcium 8.5 mg/dL      Sodium 478 mEq/L      Potassium 4.1 mEq/L      Chloride 109 mEq/L      CO2 26 mEq/L      Anion Gap 4.0    GFR [[id number]] Collected:  01/05/19 2102      Updated:  01/05/19 2129     EGFR >60.0    UA, Reflex to Microscopic (pts 3 + yrs) [[id number]]  (Abnormal) Collected:  01/05/19 2102    Specimen:  Urine Updated:  01/05/19 2129     Urine Type Clean Catch     Color, UA Yellow     Clarity, UA Clear     Specific Gravity UA 1.027     Urine pH 5.0     Leukocyte Esterase, UA Moderate     Nitrite, UA Negative     Protein, UR Negative     Glucose, UA Negative     Ketones UA Negative     Urobilinogen, UA Normal mg/dL      Bilirubin, UA Negative     Blood, UA Negative     RBC, UA 11 - 25 /hpf      WBC, UA 26 - 50 /hpf      Squamous Epithelial Cells, Urine 11 - 25 /hpf      Urine Mucus Present    CBC and differential [[id number]]  (Abnormal) Collected:  01/05/19 2102    Specimen:  Blood Updated:  01/05/19 2110     WBC 7.85 x10 3/uL      Hgb 10.2 g/dL      Hematocrit 40.1 %      Platelets 271 x10 3/uL      RBC 4.03 x10 6/uL      MCV 81.1 fL      MCH 25.3 pg      MCHC 31.2 g/dL      RDW 15 %      MPV 10.0 fL      Neutrophils 59.5 %      Lymphocytes Automated 30.7 %      Monocytes 6.5 %      Eosinophils Automated 2.5 %      Basophils Automated 0.4 %      Immature Granulocytes 0.4 %      Nucleated RBC 0.0 /100 WBC      Neutrophils Absolute 4.67 x10 3/uL      Lymphocytes Absolute Automated 2.41 x10 3/uL      Monocytes Absolute Automated 0.51 x10 3/uL      Eosinophils Absolute Automated 0.20 x10 3/uL      Basophils Absolute Automated 0.03 x10 3/uL      Immature Granulocytes Absolute 0.03 x10 3/uL      Absolute NRBC 0.00 x10 3/uL           Radiologic Studies  Radiology Results (24 Hour)     Procedure Component Value Units Date/Time    US OB < 14 Weeks with Wilhelmina Mcardle [[id number]] Collected:  01/05/19 2302    Order Status:  Completed Updated:  01/05/19 2309    Narrative:       HISTORY:  Pregnant, left-sided abdominal pain.    EDD by LMP: 09/09/2019  EDD by today's ultrasound: Not applicable.    COMPARISON: None.    TECHNIQUE: Both transabdominal and endovaginal images were obtained.   Endovaginal imaging was performed to further evaluate the intrauterine  contents and adnexa.    FINDINGS:    There is no visualized intrauterine pregnancy. The endometrial stripe is  relatively thickened at 17 mm. The uterus otherwise appears  unremarkable. The right ovary appears unremarkable, measuring 3.2 x 1.6  x 1.8 cm. The left ovary measures 4.0 x 2.4 x 3.3 cm and appears to  contain 2 corpus lutea measuring up to 2.0 cm. There are no adnexal  masses. No significant free fluid.       Impression:           Pregnancy of unknown location (no visualized IUP and no definite ectopic  pregnancy). Differential diagnosis includes early IUP, failed IUP, or  occult ectopic pregnancy. Recommend continued clinical, laboratory, and  sonographic follow-up.    Eloise Harman, MD   01/05/2019 11:07 PM            Monitors, EKG, Critical Care, Vitals and Splints   Cardiac Monitor (interpreted by ED physician):    EKG (interpreted by ED physician):   Critical Care:   Splint check:      MDM and Clinical Notes     MDM:  Eval for ectopic vs miscarriage vs UTI/pyelo.  Eval for electrolyte abnl/anemia.  Labs, [place], meds    12:10AM  Pt reports feeling improved.  Extensive discussion with pt regarding results. Comfortable with d/c to home.  Return precautions discussed.  Will follow up with ob/gyn.     Diagnosis and Disposition     Clinical Impression  1. Less than [redacted] weeks gestation of pregnancy    2. Left upper quadrant pain    3. Threatened miscarriage          Disposition  ED Disposition     ED Disposition Condition Date/Time Comment    Discharge  Sat Jan 06, 2019 12:12 AM Bettye Boeck discharge to home/self care.    Condition at disposition: Stable            Prescriptions       Discharge Prescriptions     None                 Rosamaria Lints, MD  01/06/19 [phone]

## 2019-01-31 ENCOUNTER — Emergency Department
Admission: EM | Admit: 2019-01-31 | Discharge: 2019-01-31 | Disposition: A | Discharge status: Home / Self Care | Payer: PRIVATE HEALTH INSURANCE | Attending: Emergency Medicine | Admitting: Emergency Medicine

## 2019-01-31 ENCOUNTER — Emergency Department: Payer: PRIVATE HEALTH INSURANCE

## 2019-01-31 DIAGNOSIS — R109 Unspecified abdominal pain: Secondary | ICD-10-CM

## 2019-01-31 DIAGNOSIS — O2341 Unspecified infection of urinary tract in pregnancy, first trimester: Secondary | ICD-10-CM | POA: Insufficient documentation

## 2019-01-31 DIAGNOSIS — R1030 Lower abdominal pain, unspecified: Secondary | ICD-10-CM | POA: Insufficient documentation

## 2019-01-31 DIAGNOSIS — S60222A Contusion of left hand, initial encounter: Secondary | ICD-10-CM | POA: Insufficient documentation

## 2019-01-31 DIAGNOSIS — O9989 Other specified diseases and conditions complicating pregnancy, childbirth and the puerperium: Secondary | ICD-10-CM | POA: Insufficient documentation

## 2019-01-31 DIAGNOSIS — Z3A01 Less than 8 weeks gestation of pregnancy: Secondary | ICD-10-CM | POA: Insufficient documentation

## 2019-01-31 LAB — CBC AND DIFFERENTIAL
Absolute NRBC: 0 10*3/uL (ref 0.00–0.00)
Basophils Absolute Automated: 0.03 10*3/uL (ref 0.00–0.08)
Basophils Automated: 0.3 %
Eosinophils Absolute Automated: 0 10*3/uL (ref 0.00–0.44)
Eosinophils Automated: 0 %
Hematocrit: 33.5 % — ABNORMAL LOW (ref 34.7–43.7)
Hgb: 10.8 g/dL — ABNORMAL LOW (ref 11.4–14.8)
Immature Granulocytes Absolute: 0.08 10*3/uL — ABNORMAL HIGH (ref 0.00–0.07)
Immature Granulocytes: 0.8 %
Lymphocytes Absolute Automated: 1.03 10*3/uL (ref 0.42–3.22)
Lymphocytes Automated: 9.7 %
MCH: 25.2 pg (ref 25.1–33.5)
MCHC: 32.2 g/dL (ref 31.5–35.8)
MCV: 78.3 fL (ref 78.0–96.0)
MPV: 9.8 fL (ref 8.9–12.5)
Monocytes Absolute Automated: 0.52 10*3/uL (ref 0.21–0.85)
Monocytes: 4.9 %
Neutrophils Absolute: 8.98 10*3/uL — ABNORMAL HIGH (ref 1.10–6.33)
Neutrophils: 84.3 %
Nucleated RBC: 0 /100 WBC (ref 0.0–0.0)
Platelets: 282 10*3/uL (ref 142–346)
RBC: 4.28 10*6/uL (ref 3.90–5.10)
RDW: 14 % (ref 11–15)
WBC: 10.64 10*3/uL — ABNORMAL HIGH (ref 3.10–9.50)

## 2019-01-31 LAB — URINALYSIS REFLEX TO MICROSCOPIC EXAM - REFLEX TO CULTURE
Bilirubin, UA: NEGATIVE
Blood, UA: NEGATIVE
Glucose, UA: NEGATIVE
Nitrite, UA: NEGATIVE
Protein, UR: 100 — AB
Specific Gravity UA: 1.023 (ref 1.001–1.035)
Urine pH: 6 (ref 5.0–8.0)
Urobilinogen, UA: NORMAL mg/dL (ref 0.2–2.0)

## 2019-01-31 LAB — HCG QUANTITATIVE: hCG, Quant.: 42916.2

## 2019-01-31 LAB — ECG 12-LEAD
Atrial Rate: 87 {beats}/min
P Axis: 48 degrees
P-R Interval: 174 ms
Q-T Interval: 358 ms
QRS Duration: 82 ms
QTC Calculation (Bezet): 430 ms
R Axis: 29 degrees
T Axis: 15 degrees
Ventricular Rate: 87 {beats}/min

## 2019-01-31 MED ORDER — ONDANSETRON HCL 4 MG/2ML IJ SOLN
4.00 mg | Freq: Once | INTRAMUSCULAR | Status: AC
Start: 2019-01-31 — End: 2019-01-31
  Administered 2019-01-31: 07:00:00 4 mg via INTRAVENOUS
  Filled 2019-01-31: qty 2

## 2019-01-31 MED ORDER — NITROFURANTOIN MONOHYD MACRO 100 MG PO CAPS
100.00 mg | ORAL_CAPSULE | Freq: Two times a day (BID) | ORAL | 0 refills | Status: AC
Start: 2019-01-31 — End: 2019-02-07
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
## 2019-01-31 NOTE — Discharge Instructions (Signed)
Abdominal Pain in Pregnancy     You have been seen for abdominal (belly) pain during pregnancy.    People often have abdominal (belly) pain during pregnancy. Most of the time this kind of pain is harmless. Sometimes, however, it can be a sign of a more serious problem.    Abdominal pain can be caused by some serious problems. These problems can include:     A pregnancy that is in the wrong place, like in your tubes instead of in the uterus.   A miscarriage (loss of the fetus).   Problems with your placenta (organ that feeds the fetus).   Early labor (preterm labor).   Bladder infections.   Appendicitis.    Your doctor today does not feel that you have any of the more serious problems. He or she believes it is safe for you to go home.     We dont believe your condition is dangerous right now. However, you need to be careful. Sometimes a problem that seems minor can become serious later. Therefore, it is very important for you to come back here or go to the nearest Emergency Department if you don't get better or your symptoms get worse. Close follow-up with your OB doctor is important.    Some treatments you can try at home are:     [facility] of rest.   Use stool (poop) softeners.   Take over-the-counter pain medications your OB doctor recommends.   Use a heating pad or take a warm bath.    YOU SHOULD SEEK MEDICAL ATTENTION IMMEDIATELY, EITHER HERE OR AT THE NEAREST EMERGENCY DEPARTMENT, IF ANY OF THE FOLLOWING OCCUR:     You have a fever (temperature higher than 100.98F or 38C).   You throw up often or repeatedly or if you throw up blood.   You have blood in your stool. Blood might be bright red or dark red. It can also be black and look like tar.   You have severe pain in your belly that does not get better with medications.   You have pain that stays in the right lower area of your belly only.   You feel like you are going to pass out.   You have vaginal bleeding or more discharge.    If  you can't follow up with your doctor, or if at any time you feel you need to be rechecked or seen again, come back here or go to the nearest emergency department.               Hand Contusion     You have been diagnosed with a hand contusion (bruise).    A contusion is a bruise. A contusion happens when something strikes or hits the body. This breaks small blood vessels called capillaries. When the capillaries break, blood leaks out. This makes the skin look red, purple, blue, or black. The injured area may hurt for a few days. If you take a blood thinner (like Coumadin or warfarin) the bruising may be worse.    These injuries can cause pain and swelling, and the fingers may get discolored. Your evaluation today shows that you probably don't have a broken or dislocated bone. You can expect your symptoms to get better over the next7 days.    Some things you can do to help your injury are: Resting, Icing, Compressing and Elevating the injured area. Remember this as "RICE."     REST: Limit the use of the injured body part.  ICE: By applying ice to the affected area, swelling and pain can be reduced. Place some ice cubes in a re-sealable (Ziploc) bag and add some water. Put a thin washcloth between the bag and the skin. Apply the ice bag to the area for at least 20 minutes. Do this at least 4 times per day. It is okay to do this more often than directed. You can also do it for longer than directed. NEVER APPLY ICE DIRECTLY TO THE SKIN.      COMPRESS: Compression means to apply pressure around the injured area such as with a splint, cast or an ace bandage. Compression decreases swelling and improves comfort. Compression should be tight enough to relieve swelling but not so tight as to decrease circulation. Increasing pain, numbness, tingling, or change in skin color, are all signs of decreased circulation.     ELEVATE: Elevate the injured part.     As your pain starts to get better, you'll need to do  gentle stretches with your injured hand and work on increasing your range of motion. This will help your hand from getting stiff and make the symptoms not last as long.    Your doctor may prescribe you pain medications for yourpain. You can also use over-the-counter medicines like acetaminophen (Tylenol), ibuprofen (Advil or Motrin) or naproxen (Aleve, Naprosyn). It is important to follow the directions for taking these medications.    YOU SHOULD SEEK MEDICAL ATTENTION IMMEDIATELY, EITHER HERE OR AT THE NEAREST EMERGENCY DEPARTMENT, IF ANY OF THE FOLLOWING OCCUR:     Your symptoms haven't started to get better in 5-10 days.   The hand is noticeably misshapen after the swelling gets better, especially if you didn't get x-rays during your visit. Sometimes a small fracture can't be seen easily with the 1st x-ray.   You start to have severe pain in the affected hand, or thehand becomes pale, numb, and very firm to the touch.    If you can't follow up with your doctor, or if at any time you feel you need to be rechecked or seen again, come back here or go to the nearest emergency department.
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
## 2019-03-04 ENCOUNTER — Emergency Department
Admission: EM | Admit: 2019-03-04 | Discharge: 2019-03-04 | Disposition: A | Discharge status: Home / Self Care | Payer: PRIVATE HEALTH INSURANCE | Attending: Emergency Medicine | Admitting: Emergency Medicine

## 2019-03-04 DIAGNOSIS — O219 Vomiting of pregnancy, unspecified: Secondary | ICD-10-CM | POA: Insufficient documentation

## 2019-03-04 DIAGNOSIS — Z3A12 12 weeks gestation of pregnancy: Secondary | ICD-10-CM | POA: Insufficient documentation

## 2019-03-04 DIAGNOSIS — R110 Nausea: Secondary | ICD-10-CM

## 2019-03-04 LAB — URINALYSIS REFLEX TO MICROSCOPIC EXAM - REFLEX TO CULTURE
Bilirubin, UA: NEGATIVE
Blood, UA: NEGATIVE
Glucose, UA: NEGATIVE
Ketones UA: NEGATIVE
Nitrite, UA: NEGATIVE
Protein, UR: NEGATIVE
Specific Gravity UA: 1.023 (ref 1.001–1.035)
Urine pH: 6 (ref 5.0–8.0)
Urobilinogen, UA: NORMAL mg/dL (ref 0.2–2.0)

## 2019-03-04 LAB — CBC AND DIFFERENTIAL
Absolute NRBC: 0 10*3/uL (ref 0.00–0.00)
Basophils Absolute Automated: 0.03 10*3/uL (ref 0.00–0.08)
Basophils Automated: 0.3 %
Eosinophils Absolute Automated: 0.04 10*3/uL (ref 0.00–0.44)
Eosinophils Automated: 0.5 %
Hematocrit: 34 % — ABNORMAL LOW (ref 34.7–43.7)
Hgb: 10.9 g/dL — ABNORMAL LOW (ref 11.4–14.8)
Immature Granulocytes Absolute: 0.03 10*3/uL (ref 0.00–0.07)
Immature Granulocytes: 0.3 %
Lymphocytes Absolute Automated: 1.98 10*3/uL (ref 0.42–3.22)
Lymphocytes Automated: 22.6 %
MCH: 25.1 pg (ref 25.1–33.5)
MCHC: 32.1 g/dL (ref 31.5–35.8)
MCV: 78.3 fL (ref 78.0–96.0)
MPV: 9.4 fL (ref 8.9–12.5)
Monocytes Absolute Automated: 0.57 10*3/uL (ref 0.21–0.85)
Monocytes: 6.5 %
Neutrophils Absolute: 6.13 10*3/uL (ref 1.10–6.33)
Neutrophils: 69.8 %
Nucleated RBC: 0 /100 WBC (ref 0.0–0.0)
Platelets: 269 10*3/uL (ref 142–346)
RBC: 4.34 10*6/uL (ref 3.90–5.10)
RDW: 14 % (ref 11–15)
WBC: 8.78 10*3/uL (ref 3.10–9.50)

## 2019-03-04 LAB — COMPREHENSIVE METABOLIC PANEL
ALT: 13 U/L (ref 0–55)
AST (SGOT): 10 U/L (ref 5–34)
Albumin/Globulin Ratio: 1 (ref 0.9–2.2)
Albumin: 3.1 g/dL — ABNORMAL LOW (ref 3.5–5.0)
Alkaline Phosphatase: 53 U/L (ref 37–106)
Anion Gap: 8 (ref 5.0–15.0)
BUN: 8 mg/dL (ref 7–19)
Bilirubin, Total: 0.2 mg/dL (ref 0.2–1.2)
CO2: 22 mEq/L (ref 22–29)
Calcium: 8.8 mg/dL (ref 8.5–10.5)
Chloride: 106 mEq/L (ref 100–111)
Creatinine: 0.7 mg/dL (ref 0.6–1.0)
Globulin: 3.1 g/dL (ref 2.0–3.6)
Glucose: 102 mg/dL — ABNORMAL HIGH (ref 70–100)
Potassium: 3.9 mEq/L (ref 3.5–5.1)
Protein, Total: 6.2 g/dL (ref 6.0–8.3)
Sodium: 136 mEq/L (ref 136–145)

## 2019-03-04 LAB — GFR: EGFR: >60

## 2019-03-04 LAB — HCG QUANTITATIVE: hCG, Quant.: 91472.4

## 2019-03-04 MED ORDER — ONDANSETRON HCL 4 MG/2ML IJ SOLN
4.00 mg | Freq: Once | INTRAMUSCULAR | Status: AC
Start: 2019-03-04 — End: 2019-03-04
  Administered 2019-03-04: 22:00:00 4 mg via INTRAVENOUS
  Filled 2019-03-04: qty 2

## 2019-03-04 MED ORDER — FAMOTIDINE 10 MG/ML IV SOLN (WRAP)
20.00 mg | Freq: Once | INTRAVENOUS | Status: AC
Start: 2019-03-04 — End: 2019-03-04
  Administered 2019-03-04: 22:00:00 20 mg via INTRAVENOUS
  Filled 2019-03-04: qty 2

## 2019-03-04 MED ORDER — CEPHALEXIN 500 MG PO CAPS
500.00 mg | ORAL_CAPSULE | Freq: Four times a day (QID) | ORAL | 0 refills | Status: AC
Start: 2019-03-04 — End: 2019-03-14

## 2019-03-04 MED ORDER — ONDANSETRON 4 MG PO TBDP
4.00 mg | ORAL_TABLET | Freq: Three times a day (TID) | ORAL | 0 refills | Status: AC | PRN
Start: 2019-03-04 — End: 2019-03-07

## 2019-03-04 MED ORDER — SODIUM CHLORIDE 0.9 % IV BOLUS
1000.00 mL | Freq: Once | INTRAVENOUS | Status: AC
Start: 2019-03-04 — End: 2019-03-04
  Administered 2019-03-04: 22:00:00 1000 mL via INTRAVENOUS

## 2019-03-04 NOTE — Discharge Instructions (Addendum)
See your OBGYN in 1-2 days for further evaluation. Take the antibiotics for your urine. You may wish to discuss new medication options for nausea with your OBGYN.    You may benefit from a clear liquid diet for 24 hours. Then advance your diet with the BRAT (bananas, rice, apples, and toast) diet.  Avoid spicy, heavy, fatty food and alcohol/caffeine while you are recovering.    Please follow up with your primary doctor in 1-2 days for further evaluation. Please return to an Emergency Department immediately if you develop any new or worsening symptoms, if you are not improving, if you are unable to follow up, if you have difficulty breathing, chest pain, pass out, bleeding, tarry black stools or vomit, yellowing of eyes or skin, vomiting with inability to keep anything down, fever >101F uncontrolled by medications, uncontrolled pain or swelling, inability to control or move your bowels or pass urine, paralysis or new weakness, seizure, vertigo (room spinning sensation), vision/speech/hearing change, have thoughts of hurting your self or others, hallucinations, fear for your safety, or if you think something is wrong or have any other concerns.
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
## 2019-03-04 NOTE — ED Triage Notes (Signed)
pt [redacted]wks pregnant and today nausea and vomiting worse cannot keep anything down. +heartburn intermittent pain on left side of abdomen after vomiting

## 2019-03-04 NOTE — ED Provider Notes (Signed)
[facility] EMERGENCY DEPARTMENT HISTORY AND PHYSICAL EXAM    Patient Name: Donna Watson, Donna Watson  Encounter Date:  03/04/2019  Rendering Provider: Doris Cheadle, MD  Patient DOB:  June 21, 1990  MRN:  [id number]    History of Presenting Illness     Chief Complaint:   Chief Complaint   Patient presents with    Emesis During Pregnancy       The patient Donna Watson, is a 28 y.o. femaleG1, P0 here with complaint of acute on chronic nausea vomiting nonbilious nonbloody for approximately 8 weeks.  She notes that she is [redacted] weeks pregnant, and had a normal ultrasound previously.    She notes that she has been struggling with nausea and vomiting for the last 8 weeks,. She notes she tolerates PO, then throws it up some time later.  She notes that she was maintained on antibiotic twice a day but she does not remember the name of for urinary tract infection, and completed it.  She does not have any urinary symptoms at this time.  She denies any dysuria, fever, current abdominal cramping any vaginal bleeding, any chest pain any headache any sore throat or any back pain vaginal bleeding, dark vaginal discharge, cough, numbness or weakness of extremity or face, syncope, vertigo, palpitations, prior miscarriages, medication allergies, diarrhea, abd surgery, jaundice, RUQ or RLQ pain, flank pain, other chronic past medical history or surgical history, pancreatitis, illicit drugs or smoking or alcohol.  She notes that her doctor prescribed her Zofran 4 mg however she did not take any today at all.    PCP:  Cheri Guppy, MD    Past Medical History     History reviewed. No pertinent past medical history.    Past Surgical History     Past Surgical History:   Procedure Laterality Date    KNEE SURGERY Left     KNEE SURGERY Left        Family History     History reviewed. No pertinent family history.    Social History     Social History     Socioeconomic History    Marital status: Single     Spouse name: Not on file     Number of children: Not on file    Years of education: Not on file    Highest education level: Not on file   Occupational History    Not on file   Social Needs    Financial resource strain: Not on file    Food insecurity     Worry: Not on file     Inability: Not on file    Transportation needs     Medical: Not on file     Non-medical: Not on file   Tobacco Use    Smoking status: Never Smoker    Smokeless tobacco: Never Used   Substance and Sexual Activity    Alcohol use: Not Currently     Comment: sometimes     Drug use: Never    Sexual activity: Not on file   Lifestyle    Physical activity     Days per week: Not on file     Minutes per session: Not on file    Stress: Not on file   Relationships    Social connections     Talks on phone: Not on file     Gets together: Not on file     Attends religious service: Not on file     Active  member of club or organization: Not on file     Attends meetings of clubs or organizations: Not on file     Relationship status: Not on file    Intimate partner violence     Fear of current or ex partner: Not on file     Emotionally abused: Not on file     Physically abused: Not on file     Forced sexual activity: Not on file   Other Topics Concern    Not on file   Social History Narrative    Not on file       Home Medications     Home medications reviewed by ED MD    Discharge Medication List as of 03/04/2019 11:36 PM      CONTINUE these medications which have NOT CHANGED    Details   Acetaminophen 500 MG coapsule Take 1,000 mg by mouth every 6 (six) hours as needed for Fever or Pain, Starting Sat 10/21/2018, Print      guaiFENesin (MUCINEX) 600 MG 12 hr tablet Take 2 tablets (1,200 mg total) by mouth 2 (two) times daily as needed for Congestion (cough), Starting Sat 10/21/2018, Print      ibuprofen (ADVIL) 400 MG tablet Take 1 tablet (400 mg total) by mouth every 6 (six) hours as needed for Pain, Starting Wed 12/13/2018, Print             Review of Systems     Review  of Systems   Constitutional: Negative for fever and chills.   HENT: Negative for sore throat.   Cardiovascular: Negative for chest pain.   Respiratory: Negative for cough and shortness of breath.   Gastrointestinal: + nausea, vomiting, no diarrhea.  Genitourinary: No dysuria.   Musculoskeletal: No back pain.  Skin: Negative for rash.   Neuro: Negative for syncope, headache.   All other systems reviewed and are negative.      Physical Exam   GENERAL: No acute distress, non-toxic.   HEENT: Atraumatic, Normocephalic, PERRL, EOMI, normal conjunctiva / no icterus. Moist mucous membranes, normal pharynx.   NECK: Supple/ no meningismus, non-tender.   RESPIRATORY / CHEST: No distress, Normal breath sounds.   CARDIOVASCULAR:  Regular Rhythm, Normal heart sounds, no pedal edema, no murmur.   ABDOMEN: Soft, non-tender, normal bowel sounds, no rebound.   EXTREMITY: No gross deformity, compartments soft.   BACK: Normal inspection, no midline tenderness, no CVA tenderness.   SKIN: No rash, warm, dry.   NEURO: Alert, oriented X3, normal speech, no focal deficits.       ED Medications Administered     ED Medication Orders (From admission, onward)    Start Ordered     Status Ordering Provider    03/04/19 2116 03/04/19 2115  sodium chloride 0.9 % bolus 1,000 mL  Once     Route: Intravenous  Ordered Dose: 1,000 mL     Last MAR action: Stopped Siriyah Ambrosius A    03/04/19 2116 03/04/19 2115  famotidine (PEPCID) injection 20 mg  Once     Route: Intravenous  Ordered Dose: 20 mg     Last MAR action: Given Reymundo Winship A    03/04/19 2116 03/04/19 2115  ondansetron (ZOFRAN) injection 4 mg  Once     Route: Intravenous  Ordered Dose: 4 mg     Last MAR action: Given Jabe Jeanbaptiste A          Orders Placed During This Encounter     Orders Placed This  Encounter   Procedures    Urine culture    Urine culture    CBC and differential    Comprehensive metabolic panel    UA Reflex to Micro - Reflex to Culture    GFR    Beta HCG, Quant, Serum        Diagnostic Study Results     Labs  Results     Procedure Component Value Units Date/Time    Beta HCG, Quant, Serum [[id number]] Collected: 03/04/19 2122     Updated: 03/04/19 2230     hCG, Sharene Butters. 56,213.0    Narrative:      Replace urinary catheter prior to obtaining the urine culture  if it has been in place for greater than or equal to 14  days:->N/A No Foley  Indications for U/A Reflex to Micro - Reflex to  Culture:->Suprapubic Pain/Tenderness or Dysuria    UA Reflex to Micro - Reflex to Culture [[id number]]  (Abnormal) Collected: 03/04/19 2130     Updated: 03/04/19 2153     Urine Type Urine, Clean Ca     Color, UA Yellow     Clarity, UA Clear     Specific Gravity UA 1.023     Urine pH 6.0     Leukocyte Esterase, UA Small     Nitrite, UA Negative     Protein, UR Negative     Glucose, UA Negative     Ketones UA Negative     Urobilinogen, UA Normal mg/dL      Bilirubin, UA Negative     Blood, UA Negative     RBC, UA 3 - 5 /hpf      WBC, UA 11 - 25 /hpf      Squamous Epithelial Cells, Urine 0 - 5 /hpf     Narrative:      Replace urinary catheter prior to obtaining the urine culture  if it has been in place for greater than or equal to 14  days:->N/A No Foley  Indications for U/A Reflex to Micro - Reflex to  Culture:->Suprapubic Pain/Tenderness or Dysuria    Comprehensive metabolic panel [[id number]]  (Abnormal) Collected: 03/04/19 2122    Specimen: Blood Updated: 03/04/19 2146     Glucose 102 mg/dL      BUN 8 mg/dL      Creatinine 0.7 mg/dL      Sodium 440 mEq/L      Potassium 3.9 mEq/L      Chloride 106 mEq/L      CO2 22 mEq/L      Calcium 8.8 mg/dL      Protein, Total 6.2 g/dL      Albumin 3.1 g/dL      AST (SGOT) 10 U/L      ALT 13 U/L      Alkaline Phosphatase 53 U/L      Bilirubin, Total 0.2 mg/dL      Globulin 3.1 g/dL      Albumin/Globulin Ratio 1.0     Anion Gap 8.0    Narrative:      Replace urinary catheter prior to obtaining the urine culture  if it has been in place for greater than or equal to  14  days:->N/A No Foley  Indications for U/A Reflex to Micro - Reflex to  Culture:->Suprapubic Pain/Tenderness or Dysuria    GFR [[id number]] Collected: 03/04/19 2122     Updated: 03/04/19 2146     EGFR >60.0    Narrative:      Replace urinary catheter  prior to obtaining the urine culture  if it has been in place for greater than or equal to 14  days:->N/A No Foley  Indications for U/A Reflex to Micro - Reflex to  Culture:->Suprapubic Pain/Tenderness or Dysuria    CBC and differential [[id number]]  (Abnormal) Collected: 03/04/19 2122    Specimen: Blood Updated: 03/04/19 2131     WBC 8.78 x10 3/uL      Hgb 10.9 g/dL      Hematocrit 70.3 %      Platelets 269 x10 3/uL      RBC 4.34 x10 6/uL      MCV 78.3 fL      MCH 25.1 pg      MCHC 32.1 g/dL      RDW 14 %      MPV 9.4 fL      Neutrophils 69.8 %      Lymphocytes Automated 22.6 %      Monocytes 6.5 %      Eosinophils Automated 0.5 %      Basophils Automated 0.3 %      Immature Granulocytes 0.3 %      Nucleated RBC 0.0 /100 WBC      Neutrophils Absolute 6.13 x10 3/uL      Lymphocytes Absolute Automated 1.98 x10 3/uL      Monocytes Absolute Automated 0.57 x10 3/uL      Eosinophils Absolute Automated 0.04 x10 3/uL      Basophils Absolute Automated 0.03 x10 3/uL      Immature Granulocytes Absolute 0.03 x10 3/uL      Absolute NRBC 0.00 x10 3/uL     Narrative:      Replace urinary catheter prior to obtaining the urine culture  if it has been in place for greater than or equal to 14  days:->N/A No Foley  Indications for U/A Reflex to Micro - Reflex to  Culture:->Suprapubic Pain/Tenderness or Dysuria          Radiologic Studies  Radiology Results (24 Hour)     ** No results found for the last 24 hours. **          Rendering Provider: Doris Cheadle, MD      Monitors, EKG, Critical Care, and Splints       MDM and Clinical Notes       Notes:     Well-appearing, well-hydrated, nontoxic, afebrile without antipyretics.  Abdomen benign nontender and has no current abd cramping or  bleeidng to suggest miscarriage, quant is rising as well.  Patient had transient abd discomfort when she vomited and not when not vomiting. Patient presents with nausea vomiting chronic during her pregnancy, urine without ketones however with some signs of asymptomatic bacteriuria.  Will send culture and begin antibiotics.  Not consistent with Pyelo, intrabd abscess, colitis, diverticulitis, torsion, TOA, appy, SBO, cholecystitis at this time. Not full blown hyperemeis gravidarum. May benefit from discussing/starting other antiemetic options with her OBGYN, will provide limited rx of zofran, as she has been on that-- per her request.  Re-evaluation:  Patient AOX3, feels better. Looks better. Abdomen still nontender. Tolerating PO. Wants to go. Wants zofran Rx. Updated on all results. Understands return precautions, home care, and followup plan. Patient agrees with discharge home.    Scribe and MD Attestations     I, Doris Cheadle, MD personally performed the services documented. I reviewed and confirm the accuracy of the information in this medical record.     Rendering Provider: Dolores Hoose  Teodoro Kil, MD  Diagnosis and Disposition     Clinical Impression  1. Nausea        Disposition  ED Disposition     ED Disposition Condition Date/Time Comment    Discharge  Sun Mar 04, 2019 11:17 PM Bettye Boeck discharge to home/self care.    Condition at disposition: Stable            Prescriptions     Discharge Medication List as of 03/04/2019 11:36 PM      START taking these medications    Details   cephalexin (KEFLEX) 500 MG capsule Take 1 capsule (500 mg total) by mouth 4 (four) times daily for 10 days, Starting Sun 03/04/2019, Until Wed 03/14/2019, Print                          Doris Cheadle, MD  03/05/19 [phone]

## 2019-03-12 ENCOUNTER — Emergency Department
Admission: EM | Admit: 2019-03-12 | Discharge: 2019-03-13 | Disposition: A | Discharge status: Home / Self Care | Payer: PRIVATE HEALTH INSURANCE | Attending: Emergency Medical Services | Admitting: Emergency Medical Services

## 2019-03-12 DIAGNOSIS — Z3A13 13 weeks gestation of pregnancy: Secondary | ICD-10-CM | POA: Insufficient documentation

## 2019-03-12 DIAGNOSIS — O210 Mild hyperemesis gravidarum: Secondary | ICD-10-CM

## 2019-03-12 DIAGNOSIS — R1010 Upper abdominal pain, unspecified: Secondary | ICD-10-CM | POA: Insufficient documentation

## 2019-03-12 DIAGNOSIS — O99891 Other specified diseases and conditions complicating pregnancy: Secondary | ICD-10-CM | POA: Insufficient documentation

## 2019-03-12 LAB — CBC AND DIFFERENTIAL
Absolute NRBC: 0 10*3/uL (ref 0.00–0.00)
Basophils Absolute Automated: 0.02 10*3/uL (ref 0.00–0.08)
Basophils Automated: 0.2 %
Eosinophils Absolute Automated: 0.04 10*3/uL (ref 0.00–0.44)
Eosinophils Automated: 0.4 %
Hematocrit: 36.8 % (ref 34.7–43.7)
Hgb: 12 g/dL (ref 11.4–14.8)
Immature Granulocytes Absolute: 0.04 10*3/uL (ref 0.00–0.07)
Immature Granulocytes: 0.4 %
Lymphocytes Absolute Automated: 1.37 10*3/uL (ref 0.42–3.22)
Lymphocytes Automated: 14.5 %
MCH: 25.8 pg (ref 25.1–33.5)
MCHC: 32.6 g/dL (ref 31.5–35.8)
MCV: 79 fL (ref 78.0–96.0)
MPV: 9.7 fL (ref 8.9–12.5)
Monocytes Absolute Automated: 0.34 10*3/uL (ref 0.21–0.85)
Monocytes: 3.6 %
Neutrophils Absolute: 7.61 10*3/uL — ABNORMAL HIGH (ref 1.10–6.33)
Neutrophils: 80.9 %
Nucleated RBC: 0 /100 WBC (ref 0.0–0.0)
Platelets: 304 10*3/uL (ref 142–346)
RBC: 4.66 10*6/uL (ref 3.90–5.10)
RDW: 14 % (ref 11–15)
WBC: 9.42 10*3/uL (ref 3.10–9.50)

## 2019-03-12 LAB — COMPREHENSIVE METABOLIC PANEL
ALT: 14 U/L (ref 0–55)
AST (SGOT): 11 U/L (ref 5–34)
Albumin/Globulin Ratio: 1 (ref 0.9–2.2)
Albumin: 3.5 g/dL (ref 3.5–5.0)
Alkaline Phosphatase: 67 U/L (ref 37–106)
Anion Gap: 13 (ref 5.0–15.0)
BUN: 10 mg/dL (ref 7–19)
Bilirubin, Total: 0.2 mg/dL (ref 0.2–1.2)
CO2: 21 mEq/L — ABNORMAL LOW (ref 22–29)
Calcium: 9 mg/dL (ref 8.5–10.5)
Chloride: 104 mEq/L (ref 100–111)
Creatinine: 0.7 mg/dL (ref 0.6–1.0)
Globulin: 3.6 g/dL (ref 2.0–3.6)
Glucose: 117 mg/dL — ABNORMAL HIGH (ref 70–100)
Potassium: 4 mEq/L (ref 3.5–5.1)
Protein, Total: 7.1 g/dL (ref 6.0–8.3)
Sodium: 138 mEq/L (ref 136–145)

## 2019-03-12 LAB — URINALYSIS REFLEX TO MICROSCOPIC EXAM - REFLEX TO CULTURE
Bilirubin, UA: NEGATIVE
Blood, UA: NEGATIVE
Glucose, UA: NEGATIVE
Nitrite, UA: NEGATIVE
Protein, UR: 100 — AB
Specific Gravity UA: 1.033 (ref 1.001–1.035)
Urine pH: 5 (ref 5.0–8.0)
Urobilinogen, UA: 2 mg/dL (ref 0.2–2.0)

## 2019-03-12 LAB — GFR: EGFR: >60

## 2019-03-12 LAB — URINE HCG QUALITATIVE: Urine HCG Qualitative: POSITIVE

## 2019-03-12 LAB — LIPASE: Lipase: 23 U/L (ref 8–78)

## 2019-03-12 MED ORDER — SODIUM CHLORIDE 0.9 % IV BOLUS
1000.00 mL | Freq: Once | INTRAVENOUS | Status: AC
Start: 2019-03-12 — End: 2019-03-13
  Administered 2019-03-12: 23:00:00 1000 mL via INTRAVENOUS

## 2019-03-12 MED ORDER — FAMOTIDINE 10 MG/ML IV SOLN (WRAP)
20.00 mg | Freq: Once | INTRAVENOUS | Status: AC
Start: 2019-03-12 — End: 2019-03-12
  Administered 2019-03-12: 23:00:00 20 mg via INTRAVENOUS
  Filled 2019-03-12: qty 2

## 2019-03-12 MED ORDER — ONDANSETRON HCL 4 MG/2ML IJ SOLN
4.00 mg | Freq: Once | INTRAMUSCULAR | Status: AC
Start: 2019-03-12 — End: 2019-03-12
  Administered 2019-03-12: 23:00:00 4 mg via INTRAVENOUS
  Filled 2019-03-12: qty 2
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
## 2019-03-12 NOTE — ED Triage Notes (Signed)
Patient is [redacted] weeks pregnant.  + persistent vomiting > 10 times today.  States unable to keep anything down.  + nausea.  + constipation - last BM 2 days ago.

## 2019-03-12 NOTE — ED Provider Notes (Signed)
G1P0 13 weeks hyperemesis  On 10/11 sent home with zofran    10/15 had [place] and saw her obgyn gave her phenergan.   >10 times today vomiting. Every time she eats        [facility]      EMERGENCY DEPARTMENT HISTORY AND PHYSICAL EXAM    Patient Name: Donna Watson, Donna Watson  Patient DOB:  [birth year]  MRN:  [id number]  Room:  07/A07  Rendering Provider: Tresea Mall, PA-C    History of Presenting Illness     Chief Complaint:   Chief Complaint   Patient presents with    Emesis During Pregnancy     Mechanism of Injury:       Historian:  Patient   Onset:   This morning   Severity:  Moderate      28 y.o. female G1P0 at [redacted] weeks gestation presents with vomiting. Pt states throughout her pregnancy she has been having frequent episodes of vomiting. Pt was seen in this ED on 10/11 and [place];d home with zofran which was helping initially. Over following week it stopped working as well and she had f/u with her OBGYN on 10/15. She had [place] which appeared normal and was prescribed phenergan which she was taking and seemed to be less effective than zofran. Today she had >10 episodes of NBNB emesis. Upper abd pain, worse with vomiting and improves afterwards. No vaginal bleeding. She reports acid reflux pain to chest described as burning otherwise no chest pain or sob. Pt states every time she had a drink or food today she immediately vomited. No known sick contacts.      PMD:  Cheri Guppy, MD    Past Medical History     History reviewed. No pertinent past medical history.    Past Surgical History     Past Surgical History:   Procedure Laterality Date    KNEE SURGERY Left     KNEE SURGERY Left        Family History     History reviewed. No pertinent family history.    Social History     Social History     Socioeconomic History    Marital status: Single     Spouse name: Not on file    Number of children: Not on file    Years of education: Not on file    Highest education level: Not on file   Occupational  History    Not on file   Social Needs    Financial resource strain: Not on file    Food insecurity     Worry: Not on file     Inability: Not on file    Transportation needs     Medical: Not on file     Non-medical: Not on file   Tobacco Use    Smoking status: Never Smoker    Smokeless tobacco: Never Used   Substance and Sexual Activity    Alcohol use: Not Currently     Comment: sometimes     Drug use: Never    Sexual activity: Not on file   Lifestyle    Physical activity     Days per week: Not on file     Minutes per session: Not on file    Stress: Not on file   Relationships    Social connections     Talks on phone: Not on file     Gets together: Not on file     Attends religious  service: Not on file     Active member of club or organization: Not on file     Attends meetings of clubs or organizations: Not on file     Relationship status: Not on file    Intimate partner violence     Fear of current or ex partner: Not on file     Emotionally abused: Not on file     Physically abused: Not on file     Forced sexual activity: Not on file   Other Topics Concern    Not on file   Social History Narrative    Not on file       Allergies     No Known Allergies    Home Medications     Home medications reviewed by PA at 3:10 AM    No current facility-administered medications for this encounter.     Current Outpatient Medications:     Acetaminophen 500 MG coapsule, Take 1,000 mg by mouth every 6 (six) hours as needed for Fever or Pain, Disp: 20 tablet, Rfl: 0    cephalexin (KEFLEX) 500 MG capsule, Take 1 capsule (500 mg total) by mouth 4 (four) times daily for 10 days, Disp: 40 capsule, Rfl: 0    guaiFENesin (MUCINEX) 600 MG 12 hr tablet, Take 2 tablets (1,200 mg total) by mouth 2 (two) times daily as needed for Congestion (cough), Disp: 14 tablet, Rfl: 0    ibuprofen (ADVIL) 400 MG tablet, Take 1 tablet (400 mg total) by mouth every 6 (six) hours as needed for Pain, Disp: 12 tablet, Rfl: 0    metoclopramide  (REGLAN) 10 MG tablet, Take 1 tablet (10 mg total) by mouth 4 (four) times daily, Disp: 12 tablet, Rfl: 0    ondansetron (Zofran ODT) 4 MG disintegrating tablet, Take 1 tablet (4 mg total) by mouth every 6 (six) hours as needed for Nausea, Disp: 12 tablet, Rfl: 0    ED Medications Administered     ED Medication Orders (From admission, onward)    Start Ordered     Status Ordering Provider    03/13/19 0103 03/13/19 0102  metoclopramide (REGLAN) injection 10 mg  Once     Route: Intravenous  Ordered Dose: 10 mg     Last MAR action: Given Quadasia Newsham SIMONS    03/12/19 2301 03/12/19 2300  famotidine (PEPCID) injection 20 mg  Once     Route: Intravenous  Ordered Dose: 20 mg     Last MAR action: Given Kale Rondeau SIMONS    03/12/19 2259 03/12/19 2258  ondansetron (ZOFRAN) injection 4 mg  Once     Route: Intravenous  Ordered Dose: 4 mg     Last MAR action: Given Tresea Mall SIMONS    03/12/19 2259 03/12/19 2258  sodium chloride 0.9 % bolus 1,000 mL  Once     Route: Intravenous  Ordered Dose: 1,000 mL     Last MAR action: Stopped Tresea Mall SIMONS    03/12/19 2249 03/12/19 2248  sodium chloride 0.9 % bolus 1,000 mL  Once     Route: Intravenous  Ordered Dose: 1,000 mL     Last MAR action: Stopped Huckleberry Martinson SIMONS          Review of Systems     Constitutional: Negative for fever and chills.   Respiratory: Negative for shortness of breath.   Cardiovascular: Negative for chest pain.   Gastrointestinal: Positive for abdominal pain, nausea, vomiting  Skin: Negative for rash.  Neurological: Negative for headache or dizziness.   All other systems reviewed and negative     VS     Patient Vitals for the past 24 hrs:   BP Temp Temp src Pulse Resp SpO2 Height Weight   03/13/19 0003 121/57 -- -- 68 -- 100 % -- --   03/12/19 2310 -- -- -- 63 -- 100 % -- --   03/12/19 2309 -- -- -- 65 -- 100 % -- --   03/12/19 2307 129/64 -- -- -- -- -- -- --   03/12/19 2112 132/85 99.5 F (37.5 C) Oral 72 (!) 24 100 % -- --   03/12/19 2109  -- -- -- -- -- -- 5\' 8"  (1.727 m) 155.2 kg       Physical Exam     Constitutional: Vital signs reviewed. Well appearing.  Head: Normocephalic, atraumatic  Eyes: Conjunctiva and sclera are normal.  No injection or discharge.  Neck: Normal range of motion. Trachea midline.  Respiratory/Chest: Clear to auscultation. No respiratory distress.   Cardiovascular: Regular rate and rhythm. No murmurs.   Abdomen: Soft and non-tender. No masses.  No rebound or guarding.   Upper Extremity:  No edema. No cyanosis.  Lower Extremity:  No edema. No cyanosis.  Neurological:  Alert and conversant.  No focal motor deficits by observation. Speech normal.  Skin: Warm and dry. No rash.  Psychiatric:  Normal affect.  Normal insight.     Data Review     Nursing notes reviewed and agree: Yes  Laboratory results reviewed by ED provider:  Yes    Diagnostic Study Results     Labs:     Results     Procedure Component Value Units Date/Time    Urine HCG Qualitative [[id number]] Collected: 03/12/19 2153    Specimen: Urine Updated: 03/12/19 2228     Urine HCG Qualitative Positive    Lipase [[id number]] Collected: 03/12/19 2153    Specimen: Blood Updated: 03/12/19 2227     Lipase 23 U/L     Narrative:      Replace urinary catheter prior to obtaining the urine culture  if it has been in place for greater than or equal to 14  days:->N/A No Foley  Indications for U/A Reflex to Micro - Reflex to  Culture:->Neutropenia, Pregnancy, or Undergoing Urologic  Procedure    GFR [[id number]] Collected: 03/12/19 2153     Updated: 03/12/19 2227     EGFR >60.0    Narrative:      Replace urinary catheter prior to obtaining the urine culture  if it has been in place for greater than or equal to 14  days:->N/A No Foley  Indications for U/A Reflex to Micro - Reflex to  Culture:->Neutropenia, Pregnancy, or Undergoing Urologic  Procedure    Comprehensive metabolic panel [[id number]]  (Abnormal) Collected: 03/12/19 2153    Specimen: Blood Updated: 03/12/19 2227     Glucose 117  mg/dL      BUN 10 mg/dL      Creatinine 0.7 mg/dL      Sodium 010 mEq/L      Potassium 4.0 mEq/L      Chloride 104 mEq/L      CO2 21 mEq/L      Calcium 9.0 mg/dL      Protein, Total 7.1 g/dL      Albumin 3.5 g/dL      AST (SGOT) 11 U/L      ALT 14 U/L      Alkaline Phosphatase 67  U/L      Bilirubin, Total 0.2 mg/dL      Globulin 3.6 g/dL      Albumin/Globulin Ratio 1.0     Anion Gap 13.0    Narrative:      Replace urinary catheter prior to obtaining the urine culture  if it has been in place for greater than or equal to 14  days:->N/A No Foley  Indications for U/A Reflex to Micro - Reflex to  Culture:->Neutropenia, Pregnancy, or Undergoing Urologic  Procedure    Urinalysis Reflex to Microscopic Exam- Reflex to Culture [[id number]]  (Abnormal) Collected: 03/12/19 2153     Updated: 03/12/19 2215     Urine Type Urine, Clean Ca     Color, UA Yellow     Clarity, UA Cloudy     Specific Gravity UA 1.033     Urine pH 5.0     Leukocyte Esterase, UA Moderate     Nitrite, UA Negative     Protein, UR 100     Glucose, UA Negative     Ketones UA Trace     Urobilinogen, UA 2.0 mg/dL      Bilirubin, UA Negative     Blood, UA Negative     RBC, UA 3 - 5 /hpf      WBC, UA 11 - 25 /hpf      Squamous Epithelial Cells, Urine 0 - 5 /hpf      Urine Mucus Present    Narrative:      Replace urinary catheter prior to obtaining the urine culture  if it has been in place for greater than or equal to 14  days:->N/A No Foley  Indications for U/A Reflex to Micro - Reflex to  Culture:->Neutropenia, Pregnancy, or Undergoing Urologic  Procedure    CBC and differential [[id number]]  (Abnormal) Collected: 03/12/19 2153    Specimen: Blood Updated: 03/12/19 2203     WBC 9.42 x10 3/uL      Hgb 12.0 g/dL      Hematocrit 78.2 %      Platelets 304 x10 3/uL      RBC 4.66 x10 6/uL      MCV 79.0 fL      MCH 25.8 pg      MCHC 32.6 g/dL      RDW 14 %      MPV 9.7 fL      Neutrophils 80.9 %      Lymphocytes Automated 14.5 %      Monocytes 3.6 %      Eosinophils  Automated 0.4 %      Basophils Automated 0.2 %      Immature Granulocytes 0.4 %      Nucleated RBC 0.0 /100 WBC      Neutrophils Absolute 7.61 x10 3/uL      Lymphocytes Absolute Automated 1.37 x10 3/uL      Monocytes Absolute Automated 0.34 x10 3/uL      Eosinophils Absolute Automated 0.04 x10 3/uL      Basophils Absolute Automated 0.02 x10 3/uL      Immature Granulocytes Absolute 0.04 x10 3/uL      Absolute NRBC 0.00 x10 3/uL     Narrative:      Replace urinary catheter prior to obtaining the urine culture  if it has been in place for greater than or equal to 14  days:->N/A No Foley  Indications for U/A Reflex to Micro - Reflex to  Culture:->Neutropenia, Pregnancy, or Undergoing Urologic  Procedure  Radiologic Studies:  Radiology Results (24 Hour)     ** No results found for the last 24 hours. **      .    EKG:      Monitor:      Procedures         MDM and Clinical Notes     Pt presents for second ED visit for hyperemesis. Unable to tolerate po at home. Labs essentially unremarkable. UA continues to show some leuks and 11-245 WBC's. Her prior urine culture showed 1,000-9,000 gm negative rods of questionable significance. She denies urinary symptoms. She is currently taking keflex for asymptomatic bacteriuria. ua today appears unchanged, will send additional culture, but don't feel she needs further management.     She is requesting zofran as this worked last week. Still c/o mild nausea after zofran, given Reglan. After reglan pt states she feels much better would prefer to go home and f/u with her obgyn. She has now tolerated po fluids and crackers. Return instructions given. Pt advised to return to ER for worsening symptoms or other concerns. Pt ambulated from dept without difficulty upon discharge in no distress.    The patient was seen and evaluated during the time of COVID pandemic.  Significant limitations can be present in the evaluation and management of ED patients during pandemic conditions,  including but not limited to lack of testing, rapidly changing IHS protocols, limited evaluation and follow-up resources.       Diagnosis and Disposition     Clinical Impression  1. Hyperemesis gravidarum        Disposition  ED Disposition     ED Disposition Condition Date/Time Comment    Discharge  Tue Mar 13, 2019  1:53 AM Donna Watson discharge to home/self care.    Condition at disposition: Stable          Prescriptions  Discharge Medication List as of 03/13/2019  1:53 AM      START taking these medications    Details   metoclopramide (REGLAN) 10 MG tablet Take 1 tablet (10 mg total) by mouth 4 (four) times daily, Starting Tue 03/13/2019, Print      ondansetron (Zofran ODT) 4 MG disintegrating tablet Take 1 tablet (4 mg total) by mouth every 6 (six) hours as needed for Nausea, Starting Tue 03/13/2019, Until Tue 03/20/2019, Print                Roselee Culver, [place]  03/13/19 0320       Harden Mo, MD  03/13/19 [phone]

## 2019-03-12 NOTE — ED Notes (Signed)
Pt seen for similar presentation on 10/11, states nausea was relieved by zofran.  Went to PCP and they changed to other med and it was not effective.  Returns with vomiting greater than 10 times today, unable to keep food or drink down, LUQ/LLQ pain.

## 2019-03-13 MED ORDER — METOCLOPRAMIDE HCL 10 MG PO TABS
10.00 mg | ORAL_TABLET | Freq: Four times a day (QID) | ORAL | 0 refills | Status: AC
Start: 2019-03-13 — End: ?

## 2019-03-13 MED ORDER — ONDANSETRON 4 MG PO TBDP
4.00 mg | ORAL_TABLET | Freq: Four times a day (QID) | ORAL | 0 refills | Status: AC | PRN
Start: 2019-03-13 — End: 2019-03-20

## 2019-03-13 MED ORDER — METOCLOPRAMIDE HCL 5 MG/ML IJ SOLN
10.00 mg | Freq: Once | INTRAMUSCULAR | Status: AC
Start: 2019-03-13 — End: 2019-03-13
  Administered 2019-03-13: 01:00:00 10 mg via INTRAVENOUS
  Filled 2019-03-13: qty 2

## 2019-03-13 NOTE — Discharge Instructions (Signed)
Hyperemesis Gravidarum    You have been seen for vomiting in pregnancy.   Severe vomiting that lasts a long time is called "hyperemesis."    Nausea and vomiting are very common in the first 12 weeks of pregnancy. The cause is not well understood. If you have abdominal (belly) pain with the nausea and vomiting this is a sign of a more serious problem.    If vomiting and loss of appetite cause weight loss, this can change your bodys electrolytes. This can affect the baby.     Vomiting during pregnancy is treated with medication nausea. The medications promethazine (Phenergan), metoclopramide (Reglan), prochlorperazine (Compazine) and ondasentron (Zofran) are safe to use for a short time during pregnancy.    If the doctor prescribes medications, use them as directed to help stop the vomiting.    Eat and drink frequently. Take only small bites or sips at a time. This may help you to keep the food or liquid down.    See your obstetrician in the next few days.    YOU SHOULD SEEK MEDICAL ATTENTION IMMEDIATELY, EITHER HERE OR AT THE NEAREST EMERGENCY DEPARTMENT, IF ANY OF THE FOLLOWING OCCURS:   Your vomiting continues even with nausea medication.   You see blood in your vomit.   You have trouble eating or keeping liquids down because of nausea.   You develop abdominal (belly) pain.     Thank you for choosing [facility] for your emergency care needs.  We strive to provide EXCELLENT care to you and your family.      If you do not continue to improve or your condition worsens, please contact your doctor or return immediately to the Emergency Department.    ONSITE PHARMACY  Our full service onsite pharmacy is a 2 minute walk from the ER.  Open Mon to Fri from 8 am to 8 pm, Sat and Sun 9 am to 5 pm. Ask an ED staff member for directions.  We accept all major insurances and prices are competitive with major retailers.  Ask your provider to print your prescriptions down to the pharmacy to speed you  on your way home.      DOCTOR REFERRALS  Call [phone] (available 24 hours a day, 7 days a week) if you need any further referrals and we can help you find a primary care doctor or specialist.  Also, available online at:  [email or web address]    YOUR CONTACT INFORMATION  Before leaving please check with registration to make sure we have an up-to-date contact number.  You can call registration at [phone] to update your information.  For questions about your hospital bill, please call [phone].  For questions about your Emergency Dept Physician bill please call [phone].      FREE HEALTH SERVICES  If you need help with health or social services, please call 2-1-1 for a free referral to resources in your area.  2-1-1 is a free service connecting people with information on health insurance, free clinics, pregnancy, mental health, dental care, food assistance, housing, and substance abuse counseling.  Also, available online at:  http://www.[facility].org    MEDICAL RECORDS AND TESTS  Certain laboratory test results do not come back the same day, for example urine cultures.   We will contact you if other important findings are noted.  Radiology films are often reviewed again to ensure accuracy.  If there is any discrepancy, we will notify you.      Please  call [phone] to pick up a complimentary CD of any radiology studies performed.  If you or your doctor would like to request a copy of your medical records, please call [phone].          ORTHOPEDIC INJURY   Please know that significant injuries can exist even when an initial x-ray is read as normal or negative.  This can occur because some fractures (broken bones) are not initially visible on x-rays.  For this reason, close outpatient follow-up with your primary care doctor or bone specialist (orthopedist) is required.    MEDICATIONS AND FOLLOWUP  Please be aware that some prescription medications can cause  drowsiness.  Use caution when driving or operating machinery.    The examination and treatment you have received in our Emergency Department is provided on an emergency basis, and is not intended to be a substitute for your primary care physician.  It is important that your doctor checks you again and that you report any new or remaining problems at that time.      24 HOUR PHARMACIES  [facility] [address] [place], [place] [address] (1.4 miles, 7 minutes)  Handout with directions available on request      ASSISTANCE WITH INSURANCE    Affordable Care Act  [facility])  Call to start or finish an application, compare plans, enroll or ask a question.  [phone]  TTY: [phone]  Web:  Healthcare.gov    Help Enrolling in [facility]  [employer] [place]  307-208-6093 (TOLL-FREE)  [phone] (TTY)  Web:  Http://www.coverva.org    Local Help Enrolling in the [facility]  [facility] [place] [facility]  [phone] (MAIN)  Email:  health-help@nvfs .org  Web:  BlackjackMyths.is  Address:  [address], [address] [place], [place] [address]

## 2019-11-12 ENCOUNTER — Emergency Department
Admission: EM | Admit: 2019-11-12 | Discharge: 2019-11-12 | Disposition: A | Discharge status: Home / Self Care | Payer: PRIVATE HEALTH INSURANCE | Attending: Emergency Medicine | Admitting: Emergency Medicine

## 2019-11-12 DIAGNOSIS — Z23 Encounter for immunization: Secondary | ICD-10-CM | POA: Insufficient documentation

## 2019-11-12 DIAGNOSIS — W540XXA Bitten by dog, initial encounter: Secondary | ICD-10-CM | POA: Insufficient documentation

## 2019-11-12 DIAGNOSIS — S61215A Laceration without foreign body of left ring finger without damage to nail, initial encounter: Secondary | ICD-10-CM | POA: Insufficient documentation

## 2019-11-12 MED ORDER — AMOXICILLIN-POT CLAVULANATE 875-125 MG PO TABS
1.00 | ORAL_TABLET | Freq: Once | ORAL | Status: AC
Start: 2019-11-12 — End: 2019-11-12
  Administered 2019-11-12: 23:00:00 1 via ORAL
  Filled 2019-11-12: qty 1

## 2019-11-12 MED ORDER — TETANUS-DIPHTH-ACELL PERTUSSIS 5-2.5-18.5 LF-MCG/0.5 IM SUSP
0.50 mL | Freq: Once | INTRAMUSCULAR | Status: AC
Start: 2019-11-12 — End: 2019-11-12
  Administered 2019-11-12: 23:00:00 0.5 mL via INTRAMUSCULAR
  Filled 2019-11-12: qty 0.5

## 2019-11-12 MED ORDER — AMOXICILLIN-POT CLAVULANATE 875-125 MG PO TABS
1.00 | ORAL_TABLET | Freq: Two times a day (BID) | ORAL | 0 refills | Status: AC
Start: 2019-11-12 — End: 2019-11-19

## 2019-11-12 NOTE — ED Triage Notes (Signed)
Pt presents to ED s/p a dog bite & lac to the left ring finger. Pt states that she is dog sitting. unsure of TDAP

## 2019-11-12 NOTE — ED Notes (Signed)
Pt verbalized understanding of dci, 1rx, and f/u. Denies any further questions. No IV access. Ambulatory on own with steady gait. Self to accompany home.

## 2019-11-12 NOTE — ED Provider Notes (Signed)
[facility] History and Physical Exam     Patient Name: Donna Watson, Donna Watson  Encounter Date:  11/12/2019  Attending Physician: Elray Mcgregor, MD  Patient DOB:  June 24, 1990  MRN:  [id number]  Room:  14/B14    Chief Complaint     Chief Complaint   Patient presents with    Dog bite    Finger laceration     History of Presenting Illness     29 y.o. female was dog sitting for friend today and trying to give the dog a bath.  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  The dog did not want to be bathed and bit her on the left ring finger.  Moderate dull ache.  No numbness of the finger distally.  She is able to move her fingers well.  No other injuries.        PMD:  Cheri Guppy, MD    Nursing Notes Review  Nursing Notes were reviewed.     Previous Records Review  Previous records were reviewed to the extent practicable for the current presentation.    Nursing (triage) note reviewed for the following pertinent information:  Pt presents to ED s/p a dog bite & lac to the left ring finger. Pt states that she is dog sitting. unsure of TDAP      Physical Exam     Vital Signs  BP 146/83    Pulse 81    Temp 98.6 F (37 C) (Oral)    Resp 15    Wt (!) 163 kg    LMP 12/03/2018    SpO2 100%    Breastfeeding Unknown    BMI 54.64 kg/m     Review of Vital Signs  The patient's vital signs and oxygen saturation were reviewed and interpreted by me, Elray Mcgregor, MD.    Physical Exam    Constitutional:  Well developed. Well nourished.   HENT: Normocephalic. Moist mucous membranes.   Eyes: No conjunctival discharge. EOMI. Pupils reactive.   Neck: Supple, full range of motion. No tracheal deviation.   Cardiovascular: Normal capillary refill.   Pulmonary/Chest: Normal respiratory effort.   Abdominal:  No distention.   Musculoskeletal: Normal range of motion of her left ring finger without any sign of ligamentous injury or weakness..   Neurological: Alert, normal mental status. No acute focal deficits.   Skin:  Warm skin. Not ashen.  She has a 1.5 cm laceration on the dorsal lateral ring finger at the proximal phalanx.  It is mildly tender.  There is mild gape.  Psychiatric: Normal affect. Normal behavior.           Medical Decision Making     Hx & Exam Synthesis, Differential Diagnosis, Plan     Dog bite laceration: I talked about potential risks and benefits of suturing it versus leaving it to heal by secondary intention.  She prefers that we suture it which I think is a reasonable decision.  Will give Augmentin.  Dog bite form completed.  Will update tetanus.      ED Course, Monitors, EKG, Critical Care, Splints, Consults, Reevaluation, etc        Laceration Repair Note    Performed by: Elray Mcgregor  Verbal consent obtained.   Risks, benefits and alternatives were discussed  The site was marked  Patient identity confirmed verbally with patient and arm band  Immediately prior to procedure a "time out" was called to verify the correct patient, procedure, equipment, support  staff and site/side marked as required.    Wound explored carefully.   No contamination.  No foreign bodies  No tendon involvement.  No nerve involvement.  No vascular damage.   Preparation: Patient was prepped and draped in the usual sterile fashion.  Irrigation solution: saline  Irrigation method: jet lavage  Amount of cleaning: extensive                               Local anesthetic: Bupivacaine 0.25 % without epinephrine               Laceration length cm's: 1.5           Anesthetic total cc's: 2      Skin closure: Nylon 3-o  Number of sutures: 7 approximately    Technique: running  Excellent approximation.   Patient tolerated the procedure well with no immediate complications.               Return Precautions    I counseled extensively on nature of problem--voiced understanding, agrees to follow up. Given strict return precautions--fully understands:   Pt is to return immediately to emergency department if any worsening symptoms at  all--otherwise, return to the emergency department within 2 days for a recheck or follow up with primary physician within 2 days.      Infection precautions given.      Past Medical History     History reviewed. No pertinent past medical history.    Medications     No current facility-administered medications for this encounter.    Current Outpatient Medications:     Acetaminophen 500 MG coapsule, Take 1,000 mg by mouth every 6 (six) hours as needed for Fever or Pain, Disp: 20 tablet, Rfl: 0    amoxicillin-clavulanate (Augmentin) 875-125 MG per tablet, Take 1 tablet by mouth 2 (two) times daily for 7 days, Disp: 14 tablet, Rfl: 0    guaiFENesin (MUCINEX) 600 MG 12 hr tablet, Take 2 tablets (1,200 mg total) by mouth 2 (two) times daily as needed for Congestion (cough), Disp: 14 tablet, Rfl: 0    ibuprofen (ADVIL) 400 MG tablet, Take 1 tablet (400 mg total) by mouth every 6 (six) hours as needed for Pain, Disp: 12 tablet, Rfl: 0    metoclopramide (REGLAN) 10 MG tablet, Take 1 tablet (10 mg total) by mouth 4 (four) times daily, Disp: 12 tablet, Rfl: 0    Allergies     No Known Allergies    Medical history, medications, and allergies reviewed.     Past Surgical History     Past Surgical History:   Procedure Laterality Date    KNEE SURGERY Left     KNEE SURGERY Left        Family History   The family history is not significantly contributory to current presentation.   History reviewed. No pertinent family history.    Social History   Social history is not significantly contributory to the patient's current presentation.   Social History     Occupational History    Not on file   Tobacco Use    Smoking status: Never Smoker    Smokeless tobacco: Never Used   Vaping Use    Vaping Use: Never used   Substance and Sexual Activity    Alcohol use: Not Currently     Comment: sometimes     Drug use: Yes     Comment: marijuana  Sexual activity: Not on file       Review of Systems     See HPI for review of systems  that is relevant to the current presentation.   All other systems reviewed: negative.     ED Medications Administered     ED Medication Orders (From admission, onward)    Start Ordered     Status Ordering Provider    11/12/19 2248 11/12/19 2247  amoxicillin-clavulanate (AUGMENTIN) 875-125 MG per tablet 1 tablet  Once     Route: Oral  Ordered Dose: 1 tablet     Last MAR action: Given Chaela Branscum N    11/12/19 2234 11/12/19 2233  tetanus-diphth-acell pertussis (BOOSTRIX) injection 0.5 mL  Once     Route: Intramuscular  Ordered Dose: 0.5 mL     Last MAR action: Given Signe Tackitt N          Orders Placed During This Encounter   No orders of the defined types were placed in this encounter.      Diagnostic Study Results     The results of the diagnostic studies below were reviewed by the ED provider:    Labs  Results     ** No results found for the last 24 hours. **          Radiologic Studies  Radiology Results (24 Hour)     ** No results found for the last 24 hours. Lynford Humphrey and MD Attestations     Rendering Provider: Elray Mcgregor, MD          Diagnosis and Disposition     Clinical Impression  1. Dog bite, initial encounter        Disposition  ED Disposition     ED Disposition Condition Date/Time Comment    Discharge  Mon Nov 12, 2019 10:49 PM Donna Watson discharge to home/self care.    Condition at disposition: Stable              Prescriptions       Discharge Medication List as of 11/12/2019 10:49 PM      START taking these medications    Details   amoxicillin-clavulanate (Augmentin) 875-125 MG per tablet Take 1 tablet by mouth 2 (two) times daily for 7 days, Starting Mon 11/12/2019, Until Mon 11/19/2019, Print              Elray Mcgregor, MD  11/12/19 2328

## 2019-11-12 NOTE — ED Notes (Signed)
Animal Bite form completed for patient.

## 2019-11-12 NOTE — Discharge Instructions (Signed)
#######################################    Return immediately to the emergency room if you have any worsening symptoms!!! Otherwise, see general doctor in the next 2 days, or return to the emergency department in 2 days if you have any trouble getting an appointment.       #######################################          Thank you for choosing [facility] for your emergency care needs.  We strive to provide EXCELLENT care to you and your family.      If you do not continue to improve or your condition worsens, please contact your doctor or return immediately to the Emergency Department.    The examination and treatment you have received in our Emergency Department is provided on an emergency basis, and is not intended to be a substitute for your primary care physician.  It is important that your doctor checks you again and that you report any new or remaining problems at that time.      DOCTOR REFERRALS  Call [phone] (available 24 hours a day, 7 days a week) if you need any further referrals and we can help you find a primary care doctor or specialist.  Also, available online at:  [email or web address]    YOUR CONTACT INFORMATION  Before leaving please check with registration to make sure we have an up-to-date contact number.  You can call registration at [phone] to update your information.  For questions about your hospital bill, please call [phone].  For questions about your Emergency Dept Physician bill please call [phone].      FREE HEALTH SERVICES  If you need help with health or social services, please call 2-1-1 for a free referral to resources in your area.  2-1-1 is a free service connecting people with information on health insurance, free clinics, pregnancy, mental health, dental care, food assistance, housing, and substance abuse counseling.  Also, available online at:  http://[email or web address].org    MEDICAL RECORDS AND TESTS  Certain laboratory  test results do not come back the same day, for example urine cultures.   We will contact you if other important findings are noted.  Radiology films are often reviewed again to ensure accuracy.  If there is any discrepancy, we will notify you.      Please call [phone] to pick up a complimentary CD of any radiology studies performed.  If you or your doctor would like to request a copy of your medical records, please call [phone].      ORTHOPEDIC INJURY   Please know that significant injuries can exist even when an initial x-ray is read as normal or negative.  This can occur because some fractures (broken bones) are not initially visible on x-rays.  For this reason, close outpatient follow-up with your primary care doctor or bone specialist (orthopedist) is required.    MEDICATIONS AND FOLLOWUP  Please be aware that some prescription medications can cause drowsiness.  Use caution when driving or operating machinery.    24 HOUR PHARMACIES  [facility] - [address] [place], [place] [address] (1.4 miles, 7 minutes)  [facility] - [address] [place], [place] [address] (6.5 miles, 13 minutes)  A handout with directions is available on request.    PATIENT RELATIONS  If you have any concerns, issues or feedback with your care, positive or negative, please do not hesitate to contact Patient Relations at [phone]. They are open from 8:30AM-5:00PM Monday through Friday.        Dog  Bite    You have been treated for a dog bite.    Dog bites are common. However, they do not lead to infections as often as cat or human bites.    Antibiotics are often not needed to prevent infection after dog bites. Your doctor may or may not prescribe an antibiotic. The decision will be based on several things:   Location of the wound. Bites to the hand are often treated with antibiotics to prevent infection.   Age and health of the patient. Older patients are often prescribed antibiotics. Patients with pre-existing diseases  (Diabetes) are as well.    Rabies is rare in the [place]. However, an infected dog can pass it on. Successful vaccination programs began in the 1940s. They caused a decline in dog rabies in this country. Rabies is very rare in domestic dogs (living in homes with a family). Data from the Center for Disease Control (CDC) from 2001 showed less than 1% (1 out of 100) of all animal rabies cases were found in dogs.   The rabies incubation period is long. Therefore, a domestic dog can be watched for abnormal behavior.   Wild animals should be caught. They should be turned over to local health department authorities. Unless there is no choice, professional Research scientist (life sciences) officers should catch the dog. A test on the animal can be done to see if it is infected with rabies.   Bites from animals that are unknown and not captured are at risk for transmitting rabies. Your doctor will decide whether rabies immunizations are needed. This will be based on local and state infection rates.   Bats, foxes, dogs, raccoons, cats and skunks can pass on rabies.   Rodents (squirrels, chipmunks, hamsters, rats, and mice) very rarely, if ever, carry rabies. The same is true for Lagomorphs (rabbits and hares).    Take old dressings off and put on a clean dry dressing every day. If the dressing sticks to the wound, moisten it with water. It will then come off more easily.    Keep the wound clean and dry for the next 24 hours. Avoid excessive moisture. You can wash the wound gently with soap and water.    Put a clean, dry bandage over the wound if necessary. This is to protect the wound.    YOU SHOULD SEEK MEDICAL ATTENTION IMMEDIATELY, EITHER HERE OR AT THE NEAREST EMERGENCY DEPARTMENT, IF ANY OF THE FOLLOWING OCCURS:   Unusual redness or swelling.   Red streaks starting up the arm or leg.   Drainage that smells very bad or the wound smells bad.   Fever (temperature higher than 100.22F / 38C), chills, more pain and/or  swelling.    Tell your local animal control and/or health department about the bite. If you capture a wild animal, they will watch a live animal or have the animal tested for rabies.     Laceration, Sutures    You have been treated for a laceration (cut).    Follow up with your doctor OR come back here OR go to the nearest Emergency Department to have your sutures (stitches) taken out. Sutures should be taken out in:   7 days.    Use the following wound care instructions:   Keep the wound clean and dry for the next 24 hours. You can wash the wound gently with soap and water.   DO NOT allow your wound to soak in water (dont do the dishes or go swimming, for  example). You can shower, but do not rub your stitches too hard. Let the wound dry before putting another bandage on.   Take off old dressings every day. Then put on a clean, dry dressing.   If the bandage sticks to the wound, slightly moisten it with water. This way, it can come off more easily.   You can wash the wound gently with soap and water. To help remove a scab, cleanse the area with a mixture of half hydrogen peroxide and half water. This will also help [place] to take out the sutures later.   Allow the area to dry completely before putting on a new bandage.   Unless you receive instructions not to do so, you can place a thin layer of antibiotic ointment over the wound. You can buy Polysporin, Bacitracin, or Neosporin at the store. Neosporin can sometimes cause irritation to your skin. If this happens, stop using it and switch to another topical (surface) antibiotic.   If needed, put a clean, dry bandage over the wound to protect it.    Keep the affected area elevated (lifted) for the next 24 hours. This will decrease swelling and pain. You may also want to put ice on the area. By applying ice to the affected area, swelling and pain can be reduced. Place some ice cubes in a re-sealable (Ziploc) bag and add some water. Put a thin washcloth  between the bag and the skin. Apply the ice bag to the area for at least 20 minutes. Do this at least 4 times per day. It is OK to use the ice more frequently and for longer periods of time. DO NOT APPLY ICE DIRECTLY TO THE SKIN!    If you had a local anesthetic, it will wear off in about 2 hours. Until then, be careful not to hurt yourself because of having less feeling in the area.    YOU SHOULD SEEK MEDICAL ATTENTION IMMEDIATELY, EITHER HERE OR AT THE NEAREST EMERGENCY DEPARTMENT, IF ANY OF THE FOLLOWING OCCURS:   You see redness or swelling.   There are red streaks going up from the injured area.   The wound smells bad or has a lot of drainage.   You have fever (temperature higher than 100.61F / 38C), chills, worse pain and / or swelling.

## 2021-10-05 ENCOUNTER — Emergency Department: Payer: Medicaid HMO

## 2021-10-05 ENCOUNTER — Emergency Department
Admission: EM | Admit: 2021-10-05 | Discharge: 2021-10-05 | Disposition: A | Discharge status: Home / Self Care | Payer: Medicaid HMO | Attending: Emergency Medicine | Admitting: Emergency Medicine

## 2021-10-05 DIAGNOSIS — O99891 Other specified diseases and conditions complicating pregnancy: Secondary | ICD-10-CM | POA: Insufficient documentation

## 2021-10-05 DIAGNOSIS — M7918 Myalgia, other site: Secondary | ICD-10-CM | POA: Insufficient documentation

## 2021-10-05 DIAGNOSIS — Z3A17 17 weeks gestation of pregnancy: Secondary | ICD-10-CM | POA: Insufficient documentation

## 2021-10-05 LAB — CBC AND DIFFERENTIAL
Absolute NRBC: 0 10*3/uL (ref 0.00–0.00)
Basophils Absolute Automated: 0.02 10*3/uL (ref 0.00–0.08)
Basophils Automated: 0.3 %
Eosinophils Absolute Automated: 0.11 10*3/uL (ref 0.00–0.44)
Eosinophils Automated: 1.4 %
Hematocrit: 34 % — ABNORMAL LOW (ref 34.7–43.7)
Hgb: 11.2 g/dL — ABNORMAL LOW (ref 11.4–14.8)
Immature Granulocytes Absolute: 0.03 10*3/uL (ref 0.00–0.07)
Immature Granulocytes: 0.4 %
Instrument Absolute Neutrophil Count: 5.04 10*3/uL (ref 1.10–6.33)
Lymphocytes Absolute Automated: 1.88 10*3/uL (ref 0.42–3.22)
Lymphocytes Automated: 24.7 %
MCH: 26.9 pg (ref 25.1–33.5)
MCHC: 32.9 g/dL (ref 31.5–35.8)
MCV: 81.7 fL (ref 78.0–96.0)
MPV: 10 fL (ref 8.9–12.5)
Monocytes Absolute Automated: 0.54 10*3/uL (ref 0.21–0.85)
Monocytes: 7.1 %
Neutrophils Absolute: 5.04 10*3/uL (ref 1.10–6.33)
Neutrophils: 66.1 %
Nucleated RBC: 0 /100 WBC (ref 0.0–0.0)
Platelets: 232 10*3/uL (ref 142–346)
RBC: 4.16 10*6/uL (ref 3.90–5.10)
RDW: 13 % (ref 11–15)
WBC: 7.62 10*3/uL (ref 3.10–9.50)

## 2021-10-05 LAB — HEPATIC FUNCTION PANEL
ALT: 12 U/L (ref 0–55)
AST (SGOT): 10 U/L (ref 5–41)
Albumin/Globulin Ratio: 1 (ref 0.9–2.2)
Albumin: 3 g/dL — ABNORMAL LOW (ref 3.5–5.0)
Alkaline Phosphatase: 53 U/L (ref 37–117)
Bilirubin Direct: 0.1 mg/dL (ref 0.0–0.5)
Bilirubin Indirect: 0.1 mg/dL — ABNORMAL LOW (ref 0.2–1.0)
Bilirubin, Total: 0.2 mg/dL (ref 0.2–1.2)
Globulin: 3.1 g/dL (ref 2.0–3.6)
Protein, Total: 6.1 g/dL (ref 6.0–8.3)

## 2021-10-05 LAB — BASIC METABOLIC PANEL
Anion Gap: 6 (ref 5.0–15.0)
BUN: 16 mg/dL (ref 7.0–21.0)
CO2: 21 mEq/L (ref 17–29)
Calcium: 8.9 mg/dL (ref 8.5–10.5)
Chloride: 108 mEq/L (ref 99–111)
Creatinine: 0.7 mg/dL (ref 0.4–1.0)
Glucose: 87 mg/dL (ref 70–100)
Potassium: 4.3 mEq/L (ref 3.5–5.3)
Sodium: 135 mEq/L (ref 135–145)
eGFR: >60 mL/min/{1.73_m2} (ref >=60)

## 2021-10-05 LAB — HCG, SERUM, QUALITATIVE: Hcg Qualitative: POSITIVE — AB

## 2021-10-05 LAB — HCG QUANTITATIVE: hCG, Quant.: 28184.7 m[IU]/mL

## 2021-10-05 LAB — LIPASE: Lipase: 13 U/L (ref 8–78)

## 2021-10-05 LAB — CK: Creatine Kinase (CK): 69 U/L (ref 29–233)

## 2021-10-05 MED ORDER — SODIUM CHLORIDE 0.9 % IV BOLUS
1000.0000 mL | Freq: Once | INTRAVENOUS | Status: AC
Start: 2021-10-05 — End: 2021-10-05
  Administered 2021-10-05: 1000 mL via INTRAVENOUS

## 2021-10-05 NOTE — ED Provider Notes (Signed)
I have briefly evaluated this patient as triage physician in order to facilitate and initiate the ordering of laboratory and imaging studies as needed.     Chief Complaint   Patient presents with    Back Pain      Brief HPI: This is a 31 year old female with a history of pregnancy at 14 weeks by first trimester ultrasound ([facility] [place] )female present, G2P0010 who miscarried the first pregnancy with preeclampsia at the time now with bilateral buttock pain and right lower quadrant pain since yesterday.  Pain is worse with movement and better with rest.  No nausea or vomiting.  No constipation.  No stool changes.  No UTI symptoms.  No fever or chills      Vitals:    10/05/21 1727   BP: (!) 144/92   Pulse: 79   Resp: 20   Temp: 97.3 F (36.3 C)   SpO2: 100%       Orders Placed This Encounter    Basic Metabolic Panel    CBC and differential    Lipase    Hepatic function panel (LFT)    Urinalysis Reflex to Microscopic Exam- Reflex to Culture    Beta HCG, Qual, Serum          Lucille Passy, MD     5:30 PM        Lucille Passy, MD  10/05/21 1730

## 2021-10-05 NOTE — ED Provider Notes (Signed)
[facility] History and Physical Exam     Patient Name: Donna Watson, Donna Watson  Encounter Date:  10/05/2021  Attending Physician: Elray Mcgregor, MD  Patient DOB:  June 24, 1990  MRN:  [id number]  Room:  06/A06    Chief Complaint     Chief Complaint   Patient presents with    Back Pain     History of Presenting Illness     31 y.o. female developed severe buttock pain over the last couple of days, it is bilateral, she is at about 14 weeks of pregnancy, she already had an ultrasound in this pregnancy, she had preeclampsia in her last pregnancy and it reportedly caused her to lose her fetus unfortunately.  She has been having intermittent lower abdominal pain for weeks, did not start yesterday as reported originally in triage, it has not been getting worse, and it has been going on since well before she had her first prenatal ultrasound, it has not changed recently.  She is not having any abdominal pain currently.  She has not vomited.  No diarrhea or constipation.  She is not feeling feverish.  She says that she does not feel that her legs are weak, rather she says that it is very painful in both buttocks and in both lateral upper outer thighs such that she is unable to walk because of that.  She does not have any numbness in her perineal area or in her legs.  No bowel or bladder difficulties.  No history of cauda equina syndrome.  She does have history of morbid obesity, and has had to use a walker in the past.       Severe severity, dull quality ache, exacerbated with palpation and attempted ambulation, onset was rapid.      PMD:  Cheri Guppy, MD    Nursing Notes Review  Nursing Notes were reviewed.     Previous Records Review  Previous records were reviewed to the extent practicable for the current presentation.          Physical Exam     Vital Signs  BP 127/61   Pulse 74   Temp 97.3 F (36.3 C) (Oral)   Resp 20   Ht 5\' 8"  (1.727 m)   SpO2 100%   BMI 54.64 kg/m     Review of Vital  Signs  The patient's vital signs and oxygen saturation were reviewed and interpreted by me, Elray Mcgregor, MD.    Physical Exam    Constitutional: No acute distress. Color is not ashen.  Increased BMI.  Eyes: No conjunctival discharge. EOMI.   Head, Ears, Nose, Mouth, Throat: Normocephalic. Moist mucous membranes.   Neck: Full range of motion. No obvious neck deformities. No tracheal deviation.   Cardiovascular: Normal peripheral pulses. Normal capillary refill.   Respiratory/Chest: No obvious chest wall asymmetry. No resp distress. CTAB.  Gastrointestinal/Abdominal:  Soft abdomen. No distention.   No TTP.  Musculoskeletal: Her arms have normal range of motion with no tenderness.  She has no significant pain with passive range of motion of her hips, but she is unable to actively lift either leg off of the gurney, she says because she is limited by pain.  She has tenderness to both buttocks mainly in the outer and lateral portions of the buttocks, and slightly in the proximal lateral and posterior thighs.   Back: No deformity. No significant scoliosis.  No L-spine or paraspinous lumbar tenderness.  Neurologic:  GCS is 15, No focal  motor deficits, No focal sensory deficits, 5/5 strength bilat ankles but hard to assess her knee and hip strength well because she has pain with any attempt at active range of motion.  Nml tone.   nml sensation throughout le's to light touch/pinch.   No saddle anesthesia.  symmetric bilat patellar and achilles reflexes.  I attempted to get her up to walk but on my initial exam she was unable to do so.  Skin: Warm, color not mottled. No acute rash.         Medical Decision Making     Hx & Exam Synthesis, Differential Diagnosis, Plan     Buttock pain with severe ambulation difficulty: Most likely musculoskeletal etiology coupled with her morbid obesity, she may be getting a degree of sciatica.  She says her mom had severe sciatica during her pregnancy and seemed to have similar  symptoms.  Unlikely to represent cauda equina syndrome overall but given her inability to ambulate, will order MRI.    Her abdominal pain is not new, but we do not have any documented intrauterine pregnancy at this time therefore, even though my suspicion for acute ectopic pregnancy is low, CMS requirements mandate ordering ultrasound.    We will check labs including CK as well.    I recommended that we give pain medication such as Tylenol, if not something stronger, but she declines all medications.      ED Course, Monitors, EKG, Critical Care, Splints, Consults, Reevaluation, etc           ED Course as of 10/05/21 2204   Mon Oct 05, 2021   1852 Intermit abd pain, pt says it has been going on since before her u/s in this pregnancy (which she says showed IUP), but since we have no documented iup in our system, I will order u/s to avoid penalty from CMS requirements.    [CM]   2105 She is refusing all medications. [CM]   2123 W/up unrem including mri, u/s, labs. She is smiling. Still doesn't want meds. Will do trial of ambulation with walker (she has used a walker in the past, has morbid obesity), and plan to [place] c close f/up.  [CM]   2124 I counseled extensively on nature of problem--voiced understanding, agrees to follow up. Given strict return precautions--fully understands:   Pt is to return immediately to emergency department if any worsening symptoms at all--otherwise, return to the emergency department within 2 days for a recheck or follow up with primary physician and gynecologist within 2 days.       [CM]      ED Course User Index  [CM] Elray Mcgregor, MD       She was able to ambulate well with a walker.      Past Medical History     History reviewed. No pertinent past medical history.    Medications     No current facility-administered medications for this encounter.    Current Outpatient Medications:     Acetaminophen 500 MG coapsule, Take 1,000 mg by mouth every 6 (six) hours as needed for Fever or  Pain, Disp: 20 tablet, Rfl: 0    guaiFENesin (MUCINEX) 600 MG 12 hr tablet, Take 2 tablets (1,200 mg total) by mouth 2 (two) times daily as needed for Congestion (cough), Disp: 14 tablet, Rfl: 0    ibuprofen (ADVIL) 400 MG tablet, Take 1 tablet (400 mg total) by mouth every 6 (six) hours as needed for Pain, Disp: 12 tablet,  Rfl: 0    metoclopramide (REGLAN) 10 MG tablet, Take 1 tablet (10 mg total) by mouth 4 (four) times daily, Disp: 12 tablet, Rfl: 0    Allergies     Allergies   Allergen Reactions    Nickel Rash       Medical history, medications, and allergies reviewed.     Past Surgical History     Past Surgical History:   Procedure Laterality Date    KNEE SURGERY Left     KNEE SURGERY Left        Family History   The family history is not significantly contributory to current presentation.   History reviewed. No pertinent family history.    Social History   Social history is not significantly contributory to the patient's current presentation.   Social History     Occupational History    Not on file   Tobacco Use    Smoking status: Never    Smokeless tobacco: Never   Vaping Use    Vaping status: Never Used   Substance and Sexual Activity    Alcohol use: Not Currently     Comment: sometimes     Drug use: Yes     Comment: marijuana     Sexual activity: Not on file       Review of Systems     See HPI for review of systems that is relevant to the current presentation.   All other systems reviewed: negative.     ED Medications Administered     ED Medication Orders (From admission, onward)      Start Ordered     Status Ordering Provider    10/05/21 1851 10/05/21 1850  sodium chloride 0.9 % bolus 1,000 mL  Once        Route: Intravenous  Ordered Dose: 1,000 mL       Last MAR action: New Bag Mirissa Lopresti N            Orders Placed During This Encounter     Orders Placed This Encounter   Procedures    Walker standard    MRI L-Spine without Contrast    Basic Metabolic Panel    CBC and differential    Lipase     Hepatic function panel (LFT)    Urinalysis Reflex to Microscopic Exam- Reflex to Culture    Beta HCG, Qual, Serum    Creatine Kinase (CK)    Beta HCG Quantitative    Diet NPO effective now    Instruct Walker    Ambulate Patient    US OB 14 + Weeks Single Or First Gestation       Diagnostic Study Results     The results of the diagnostic studies below were reviewed by the ED provider:    Labs  Results       Procedure Component Value Units Date/Time    Beta HCG Quantitative [[id number]] Collected: 10/05/21 1747     Updated: 10/05/21 2102     hCG, Quant. 28,184.7 mIU/mL     Creatine Kinase (CK) [[id number]] Collected: 10/05/21 1747     Updated: 10/05/21 2032     Creatine Kinase (CK) 69 U/L     Beta HCG, Qual, Serum [[id number]]  (Abnormal) Collected: 10/05/21 1747    Specimen: Blood Updated: 10/05/21 1856     Hcg Qualitative Positive    Basic Metabolic Panel [[id number]] Collected: 10/05/21 1747    Specimen: Blood Updated: 10/05/21 1841     Glucose 87 mg/dL  BUN 16.0 mg/dL      Creatinine 0.7 mg/dL      Calcium 8.9 mg/dL      Sodium 161 mEq/L      Potassium 4.3 mEq/L      Chloride 108 mEq/L      CO2 21 mEq/L      Anion Gap 6.0     eGFR >60.0 mL/min/1.73 m2     Lipase [[id number]] Collected: 10/05/21 1747    Specimen: Blood Updated: 10/05/21 1841     Lipase 13 U/L     Hepatic function panel (LFT) [[id number]]  (Abnormal) Collected: 10/05/21 1747    Specimen: Blood Updated: 10/05/21 1841     Bilirubin, Total 0.2 mg/dL      Bilirubin Direct 0.1 mg/dL      Bilirubin Indirect 0.1 mg/dL      AST (SGOT) 10 U/L      ALT 12 U/L      Alkaline Phosphatase 53 U/L      Protein, Total 6.1 g/dL      Albumin 3.0 g/dL      Globulin 3.1 g/dL      Albumin/Globulin Ratio 1.0    CBC and differential [[id number]]  (Abnormal) Collected: 10/05/21 1747    Specimen: Blood Updated: 10/05/21 1806     WBC 7.62 x10 3/uL      Hgb 11.2 g/dL      Hematocrit 57.8 %      Platelets 232 x10 3/uL      RBC 4.16 x10 6/uL      MCV 81.7 fL      MCH 26.9 pg       MCHC 32.9 g/dL      RDW 13 %      MPV 10.0 fL      Instrument Absolute Neutrophil Count 5.04 x10 3/uL      Neutrophils 66.1 %      Lymphocytes Automated 24.7 %      Monocytes 7.1 %      Eosinophils Automated 1.4 %      Basophils Automated 0.3 %      Immature Granulocytes 0.4 %      Nucleated RBC 0.0 /100 WBC      Neutrophils Absolute 5.04 x10 3/uL      Lymphocytes Absolute Automated 1.88 x10 3/uL      Monocytes Absolute Automated 0.54 x10 3/uL      Eosinophils Absolute Automated 0.11 x10 3/uL      Basophils Absolute Automated 0.02 x10 3/uL      Immature Granulocytes Absolute 0.03 x10 3/uL      Absolute NRBC 0.00 x10 3/uL             Radiologic Studies  Radiology Results (24 Hour)       Procedure Component Value Units Date/Time    US OB 14 + Weeks Single Or First Gestation [[id number]] Collected: 10/05/21 2055    Order Status: Completed Updated: 10/05/21 2102    Narrative:      HISTORY: Intermittent abdominal pain. Pregnancy, predicted gestational age by given dates, 15 weeks 4 days     EDD by dates: 03/25/2022  EDD by today's ultrasound: 03/15/2022    COMPARISON: None this gestation    FINDINGS:    POSITION: Variable  ACTIVITY: Normal gross body movement. Normal heart rate and rhythm. Heart rate is 150 beats per minute.    BIOMETRY:  BPD:  3.6 cm;   17 weeks 1 day  Head circumference: 13.7 cm;   17  weeks 1 day  Abdominal circumference:  10.8 cm;   16 weeks 1 day  Femur length:  2.2 cm;    16 weeks 5 days  AUA: 17 weeks 0 days    FETAL ANATOMY: Limited by early gestational age.  Stomach and GI tract: Normal.  Kidneys and bladder: Normal.   Umbilical cord: Two umbilical arteries.     AMNIOTIC FLUID: Normal.  PLACENTA: Anterior, without placenta previa, abruption, or accreta. Normal placental cord insertion.  CERVIX: Normal length (5.7 cm) and appearance on transabdominal scan.  UTERUS/ADNEXA: No fibroids or adnexal masses.      Impression:        1. Single living intrauterine pregnancy at 17 weeks 0 days gestational  age by today's ultrasound.  2. No maternal or fetal abnormalities detected. A dedicated anatomic survey is recommended 20-[redacted] weeks gestational age.    Geanie [place], MD  10/05/2021 8:59 PM    MRI L-Spine without Contrast [[id number]] Collected: 10/05/21 2043    Order Status: Completed Updated: 10/05/21 2047    Narrative:      HISTORY: Bilateral buttock pain and inability to walk.    COMPARISON: None available.    TECHNIQUE: MRI of the lumbar spine performed on a 1.5 Tesla scanner without intravenous contrast.       FINDINGS:   Alignment is maintained. Vertebral body height is maintained. No suspicious or destructive osseous marrow lesions. The conus terminates normally at L1-L2 level. The nerve roots course caudally without evidence of clumping.    Intervertebral disc signal is maintained. No disc herniation. The spinal canal and neural foramen are patent at all lumbar spine levels.    Nonspecific mild edema in the lumbar subcutaneous tissues.      Impression:          Normal noncontrast MR lumbar spine exam.    Gaylyn Rong, MD  10/05/2021 8:45 PM            Scribe and MD Attestations     Rendering Provider: Elray Mcgregor, MD          Diagnosis and Disposition     Clinical Impression  1. Bilateral buttock pain    2. [redacted] weeks gestation of pregnancy        Disposition  ED Disposition       ED Disposition   Discharge    Condition   --    Date/Time   Mon Oct 05, 2021  9:20 PM    Comment   Donna Watson discharge to home/self care.    Condition at disposition: Stable                     Prescriptions       New Prescriptions    No medications on file          Elray Mcgregor, MD  10/05/21 2204

## 2021-10-05 NOTE — ED Triage Notes (Signed)
States unable to walk this AM. Complains of "pain to the lower part of my buttock". Respirations even and unlabored. Skin is warm and dry. Speaking in full sentences. Moving all extremities. Arrives via wheelchair. AOX4.  States is currently [redacted] weeks pregnant. States has history of pre-eclampsia. States has some lower abdominal pain.

## 2021-10-05 NOTE — ED Notes (Signed)
Pt states she feels pain on both sides of her lower buttocks. Denies injury.

## 2021-10-05 NOTE — Discharge Instructions (Signed)
Thank you for choosing [facility] for your emergency care needs.  We strive to provide EXCELLENT care to you and your family.      If you do not continue to improve or your condition worsens, please contact your doctor or return immediately to the Emergency Department.    The examination and treatment you have received in our Emergency Department is provided on an emergency basis, and is not intended to be a substitute for your primary care physician.  It is important that your doctor checks you again and that you report any new or remaining problems at that time.      DOCTOR REFERRALS  Call [phone] (available 24 hours a day, 7 days a week) if you need any further referrals and we can help you find a primary care doctor or specialist.  Also, available online at:  [email or web address]    YOUR CONTACT INFORMATION  Before leaving please check with registration to make sure we have an up-to-date contact number.  You can call registration at [phone] to update your information.  For questions about your hospital bill, please call [phone].  For questions about your Emergency Dept Physician bill please call [phone].      FREE HEALTH SERVICES  If you need help with health or social services, please call 2-1-1 for a free referral to resources in your area.  2-1-1 is a free service connecting people with information on health insurance, free clinics, pregnancy, mental health, dental care, food assistance, housing, and substance abuse counseling.  Also, available online at:  [email or web address]    MEDICAL RECORDS AND TESTS  Certain laboratory test results do not come back the same day, for example urine cultures.   We will contact you if other important findings are noted.  Radiology films are often reviewed again to ensure accuracy.  If there is any discrepancy, we will notify you.      Please call [phone] to pick up a complimentary CD of any radiology  studies performed.  If you or your doctor would like to request a copy of your medical records, please call [phone].      ORTHOPEDIC INJURY   Please know that significant injuries can exist even when an initial x-ray is read as normal or negative.  This can occur because some fractures (broken bones) are not initially visible on x-rays.  For this reason, close outpatient follow-up with your primary care doctor or bone specialist (orthopedist) is required.    MEDICATIONS AND FOLLOWUP  Please be aware that some prescription medications can cause drowsiness.  Use caution when driving or operating machinery.    24 HOUR PHARMACIES  [facility] - [address], [place], [place] [address] (1.4 miles, 7 minutes)  [facility] - [address], [place], [place] [address] (6.5 miles, 13 minutes)  A handout with directions is available on request.    PATIENT RELATIONS  If you have any concerns, issues or feedback with your care, positive or negative, please do not hesitate to contact Patient Relations at [phone]. They are open from 8:30AM-5:00PM Monday through Friday.              What matters most:    ###################################    Return immediately to the emergency room if you have any worsening symptoms!!! Otherwise, see general doctor and gynecologist in the next 2 days, or return to the emergency department in 2 days if you have any trouble getting an appointment.       ###################################

## 2021-12-02 ENCOUNTER — Observation Stay: Payer: Medicaid HMO

## 2021-12-02 ENCOUNTER — Observation Stay
Admission: RE | Admit: 2021-12-02 | Discharge: 2021-12-02 | Disposition: A | Discharge status: Home / Self Care | Payer: Medicaid HMO | Source: Ambulatory Visit | Attending: Obstetrics & Gynecology | Admitting: Obstetrics & Gynecology

## 2021-12-02 DIAGNOSIS — W109XXA Fall (on) (from) unspecified stairs and steps, initial encounter: Secondary | ICD-10-CM | POA: Insufficient documentation

## 2021-12-02 DIAGNOSIS — Z7982 Long term (current) use of aspirin: Secondary | ICD-10-CM | POA: Insufficient documentation

## 2021-12-02 DIAGNOSIS — Z538 Procedure and treatment not carried out for other reasons: Secondary | ICD-10-CM | POA: Insufficient documentation

## 2021-12-02 DIAGNOSIS — O358XX0 Maternal care for other (suspected) fetal abnormality and damage, not applicable or unspecified: Secondary | ICD-10-CM | POA: Insufficient documentation

## 2021-12-02 DIAGNOSIS — Z3A23 23 weeks gestation of pregnancy: Secondary | ICD-10-CM | POA: Insufficient documentation

## 2021-12-02 DIAGNOSIS — Z043 Encounter for examination and observation following other accident: Principal | ICD-10-CM | POA: Insufficient documentation

## 2021-12-02 MED ORDER — SIMETHICONE 80 MG PO CHEW
80.0000 mg | CHEWABLE_TABLET | Freq: Four times a day (QID) | ORAL | Status: DC | PRN
Start: 2021-12-02 — End: 2021-12-03

## 2021-12-02 MED ORDER — SODIUM CHLORIDE 0.9 % IV SOLN
6.2500 mg | Freq: Four times a day (QID) | INTRAVENOUS | Status: DC | PRN
Start: 2021-12-02 — End: 2021-12-03

## 2021-12-02 MED ORDER — ACETAMINOPHEN 325 MG PO TABS
650.0000 mg | ORAL_TABLET | ORAL | Status: DC | PRN
Start: 2021-12-02 — End: 2021-12-03

## 2021-12-02 MED ORDER — SOD CITRATE-CITRIC ACID 500-334 MG/5ML PO SOLN
30.0000 mL | Freq: Once | ORAL | Status: DC | PRN
Start: 2021-12-02 — End: 2021-12-03

## 2021-12-02 MED ORDER — ONDANSETRON HCL 4 MG/2ML IJ SOLN
4.0000 mg | Freq: Three times a day (TID) | INTRAMUSCULAR | Status: DC | PRN
Start: 2021-12-02 — End: 2021-12-03

## 2021-12-02 MED ORDER — ONDANSETRON 4 MG PO TBDP
4.0000 mg | ORAL_TABLET | Freq: Three times a day (TID) | ORAL | Status: DC | PRN
Start: 2021-12-02 — End: 2021-12-03

## 2021-12-02 MED ORDER — CALCIUM CARBONATE ANTACID 500 MG PO CHEW
1000.0000 mg | CHEWABLE_TABLET | Freq: Three times a day (TID) | ORAL | Status: DC | PRN
Start: 2021-12-02 — End: 2021-12-03

## 2021-12-02 MED ORDER — PROMETHAZINE HCL 25 MG PO TABS
12.5000 mg | ORAL_TABLET | Freq: Four times a day (QID) | ORAL | Status: DC | PRN
Start: 2021-12-02 — End: 2021-12-03

## 2021-12-02 MED ORDER — ACETAMINOPHEN 650 MG RE SUPP
650.0000 mg | RECTAL | Status: DC | PRN
Start: 2021-12-02 — End: 2021-12-03

## 2021-12-02 MED ORDER — PROMETHAZINE HCL 25 MG RE SUPP
12.5000 mg | Freq: Four times a day (QID) | RECTAL | Status: DC | PRN
Start: 2021-12-02 — End: 2021-12-03

## 2021-12-02 MED ORDER — ALUM & MAG HYDROXIDE-SIMETH 200-200-20 MG/5ML PO SUSP
30.0000 mL | Freq: Four times a day (QID) | ORAL | Status: DC | PRN
Start: 2021-12-02 — End: 2021-12-03

## 2021-12-02 MED ORDER — NIFEDIPINE 10 MG PO CAPS
10.0000 mg | ORAL_CAPSULE | Freq: Once | ORAL | Status: DC | PRN
Start: 2021-12-02 — End: 2021-12-03

## 2021-12-02 MED ORDER — NIFEDIPINE 10 MG PO CAPS
20.0000 mg | ORAL_CAPSULE | Freq: Once | ORAL | Status: DC | PRN
Start: 2021-12-02 — End: 2021-12-03

## 2021-12-02 NOTE — Discharge Instr - AVS First Page (Addendum)
Reason for your Hospital Admission:  fall    Instructions for after your discharge:  Home Undelivered Discharge Instructions    After Discharge Orders:    Dr Winfred Burn will send a message to your office to get an appt for next week if you havent heard from them by Friday 7/14 ca;ll the appointment line        Call physician or midwife, return to Labor and Delivery, call 911, or go to the nearest Emergency Room if:   Contractions become stronger and regular, every 5 min for one hour. (Prior to 36 weeks, call if you have more than 4 contractions in an hour).  Bag of water ruptures (sudden gush or continuous leak) Even if no contractions.  Vaginal bleeding occurs (like a menstrual period, running down legs or passing clots) Even if no contractions.  Spotting after a vaginal exam is normal.  You experience any unusually sharp abdominal pain  You notice a decrease in fetal movement.

## 2021-12-02 NOTE — Progress Notes (Signed)
RN called Dr. Laural Benes to make her aware of pt's arrival. Dr. Laural Benes coming to evaluate pt.

## 2021-12-02 NOTE — Progress Notes (Signed)
Pt returned from ultrasound.  Further plan of care reviewed.
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
## 2021-12-02 NOTE — Progress Notes (Signed)
Dr. Winfred Burn at the bedside.  Assessment complete.  Pt states she is still having some leg pain but does not want to go to the ER for evaluation at this time.  Discharge instructions reviewed with the pt, pt verbalizes understanding.  Provided with a note for work

## 2021-12-02 NOTE — Progress Notes (Signed)
Discharge instructions reviewed, pt verbalizes understanding and provided with a written copy.  Off unit ambulatory accompanied by RN.

## 2022-06-17 ENCOUNTER — Encounter (HOSPITAL_BASED_OUTPATIENT_CLINIC_OR_DEPARTMENT_OTHER): Payer: Self-pay
